# Patient Record
Sex: Female | Born: 1985 | Race: Black or African American | Hispanic: No | Marital: Single | Smoking: Former smoker
Health system: Southern US, Community
[De-identification: ages and names within clinical notes are randomized; demographics above are authoritative.]

## PROBLEM LIST (undated history)

## (undated) DIAGNOSIS — E0590 Thyrotoxicosis, unspecified without thyrotoxic crisis or storm: Secondary | ICD-10-CM

## (undated) DIAGNOSIS — D219 Benign neoplasm of connective and other soft tissue, unspecified: Secondary | ICD-10-CM

## (undated) DIAGNOSIS — D649 Anemia, unspecified: Secondary | ICD-10-CM

---

## 2012-10-24 DIAGNOSIS — E0590 Thyrotoxicosis, unspecified without thyrotoxic crisis or storm: Secondary | ICD-10-CM | POA: Insufficient documentation

## 2014-02-03 ENCOUNTER — Other Ambulatory Visit (HOSPITAL_COMMUNITY)
Admission: RE | Admit: 2014-02-03 | Discharge: 2014-02-03 | Disposition: A | Discharge status: Home / Self Care | Payer: 59 | Source: Ambulatory Visit | Attending: Physician Assistant | Admitting: Physician Assistant

## 2014-02-03 DIAGNOSIS — Z124 Encounter for screening for malignant neoplasm of cervix: Secondary | ICD-10-CM | POA: Insufficient documentation

## 2014-02-03 DIAGNOSIS — Z113 Encounter for screening for infections with a predominantly sexual mode of transmission: Secondary | ICD-10-CM | POA: Insufficient documentation

## 2014-02-03 DIAGNOSIS — Z1151 Encounter for screening for human papillomavirus (HPV): Secondary | ICD-10-CM | POA: Diagnosis present

## 2014-07-09 ENCOUNTER — Other Ambulatory Visit (HOSPITAL_COMMUNITY): Payer: Self-pay | Admitting: Internal Medicine

## 2014-07-09 DIAGNOSIS — E0590 Thyrotoxicosis, unspecified without thyrotoxic crisis or storm: Secondary | ICD-10-CM

## 2014-07-16 ENCOUNTER — Encounter (HOSPITAL_COMMUNITY): Payer: 59

## 2014-07-17 ENCOUNTER — Encounter (HOSPITAL_COMMUNITY): Payer: 59

## 2014-08-21 ENCOUNTER — Encounter (HOSPITAL_COMMUNITY): Payer: 59 | Attending: Internal Medicine

## 2014-08-22 ENCOUNTER — Encounter (HOSPITAL_COMMUNITY): Admission: RE | Admit: 2014-08-22 | Payer: 59 | Source: Ambulatory Visit

## 2015-04-23 ENCOUNTER — Encounter (HOSPITAL_BASED_OUTPATIENT_CLINIC_OR_DEPARTMENT_OTHER): Payer: Self-pay

## 2015-04-23 ENCOUNTER — Emergency Department (HOSPITAL_BASED_OUTPATIENT_CLINIC_OR_DEPARTMENT_OTHER)
Admission: EM | Admit: 2015-04-23 | Discharge: 2015-04-23 | Disposition: A | Discharge status: Home / Self Care | Payer: 59 | Attending: Emergency Medicine | Admitting: Emergency Medicine

## 2015-04-23 DIAGNOSIS — R000 Tachycardia, unspecified: Secondary | ICD-10-CM | POA: Diagnosis not present

## 2015-04-23 DIAGNOSIS — E0590 Thyrotoxicosis, unspecified without thyrotoxic crisis or storm: Secondary | ICD-10-CM | POA: Diagnosis not present

## 2015-04-23 DIAGNOSIS — Z720 Tobacco use: Secondary | ICD-10-CM | POA: Diagnosis not present

## 2015-04-23 DIAGNOSIS — E049 Nontoxic goiter, unspecified: Secondary | ICD-10-CM | POA: Insufficient documentation

## 2015-04-23 DIAGNOSIS — R634 Abnormal weight loss: Secondary | ICD-10-CM | POA: Insufficient documentation

## 2015-04-23 DIAGNOSIS — F1210 Cannabis abuse, uncomplicated: Secondary | ICD-10-CM | POA: Insufficient documentation

## 2015-04-23 DIAGNOSIS — R002 Palpitations: Secondary | ICD-10-CM | POA: Diagnosis present

## 2015-04-23 DIAGNOSIS — Z3202 Encounter for pregnancy test, result negative: Secondary | ICD-10-CM | POA: Diagnosis not present

## 2015-04-23 DIAGNOSIS — R61 Generalized hyperhidrosis: Secondary | ICD-10-CM | POA: Insufficient documentation

## 2015-04-23 HISTORY — DX: Thyrotoxicosis, unspecified without thyrotoxic crisis or storm: E05.90

## 2015-04-23 LAB — CBC WITH DIFFERENTIAL/PLATELET
BASOS ABS: 0 10*3/uL (ref 0.0–0.1)
Basophils Relative: 0 %
EOS ABS: 0.1 10*3/uL (ref 0.0–0.7)
Eosinophils Relative: 1 %
HCT: 32.9 % — ABNORMAL LOW (ref 36.0–46.0)
Hemoglobin: 10.6 g/dL — ABNORMAL LOW (ref 12.0–15.0)
LYMPHS ABS: 3.4 10*3/uL (ref 0.7–4.0)
Lymphocytes Relative: 58 %
MCH: 23.9 pg — ABNORMAL LOW (ref 26.0–34.0)
MCHC: 32.2 g/dL (ref 30.0–36.0)
MCV: 74.1 fL — ABNORMAL LOW (ref 78.0–100.0)
MONO ABS: 0.8 10*3/uL (ref 0.1–1.0)
Monocytes Relative: 13 %
Neutro Abs: 1.7 10*3/uL (ref 1.7–7.7)
Neutrophils Relative %: 28 %
PLATELETS: 323 10*3/uL (ref 150–400)
RBC: 4.44 MIL/uL (ref 3.87–5.11)
RDW: 15 % (ref 11.5–15.5)
WBC: 6 10*3/uL (ref 4.0–10.5)

## 2015-04-23 LAB — COMPREHENSIVE METABOLIC PANEL
ALK PHOS: 88 U/L (ref 38–126)
ALT: 20 U/L (ref 14–54)
AST: 21 U/L (ref 15–41)
Albumin: 3.3 g/dL — ABNORMAL LOW (ref 3.5–5.0)
Anion gap: 5 (ref 5–15)
BUN: 11 mg/dL (ref 6–20)
CO2: 25 mmol/L (ref 22–32)
CREATININE: 0.44 mg/dL (ref 0.44–1.00)
Calcium: 8.8 mg/dL — ABNORMAL LOW (ref 8.9–10.3)
Chloride: 105 mmol/L (ref 101–111)
GFR calc non Af Amer: >60 mL/min (ref >60)
Glucose, Bld: 98 mg/dL (ref 65–99)
Potassium: 3.9 mmol/L (ref 3.5–5.1)
SODIUM: 135 mmol/L (ref 135–145)
TOTAL PROTEIN: 6.9 g/dL (ref 6.5–8.1)
Total Bilirubin: 0.1 mg/dL — ABNORMAL LOW (ref 0.3–1.2)

## 2015-04-23 LAB — RAPID URINE DRUG SCREEN, HOSP PERFORMED
Amphetamines: NOT DETECTED
Barbiturates: NOT DETECTED
Benzodiazepines: NOT DETECTED
Cocaine: NOT DETECTED
OPIATES: NOT DETECTED
TETRAHYDROCANNABINOL: POSITIVE — AB

## 2015-04-23 LAB — T4, FREE: FREE T4: 4.58 ng/dL — AB (ref 0.61–1.12)

## 2015-04-23 LAB — PREGNANCY, URINE: PREG TEST UR: NEGATIVE

## 2015-04-23 LAB — TSH: TSH: <0.01 u[IU]/mL — ABNORMAL LOW (ref 0.350–4.500)

## 2015-04-23 MED ORDER — ATENOLOL 25 MG PO TABS: 25.0000 mg | ORAL_TABLET | Freq: Every day | ORAL | Status: DC

## 2015-04-23 MED ORDER — METHIMAZOLE 10 MG PO TABS: 10.0000 mg | ORAL_TABLET | Freq: Every day | ORAL | Status: DC

## 2015-04-23 NOTE — Discharge Instructions (Signed)
Hyperthyroidism Hyperthyroidism is when the thyroid is too active (overactive). Your thyroid is a large gland that is located in your neck. The thyroid helps to control how your body uses food (metabolism). When your thyroid is overactive, it produces too much of a hormone called thyroxine.  CAUSES Causes of hyperthyroidism may include:  Graves disease. This is when your immune system attacks the thyroid gland. This is the most common cause.  Inflammation of the thyroid gland.  Tumor in the thyroid gland or somewhere else.  Excessive use of thyroid medicines, including:  Prescription thyroid supplement.  Herbal supplements that mimic thyroid hormones.  Solid or fluid-filled lumps within your thyroid gland (thyroid nodules).  Excessive ingestion of iodine. RISK FACTORS  Being female.  Having a family history of thyroid conditions. SIGNS AND SYMPTOMS Signs and symptoms of hyperthyroidism may include:  Nervousness.  Inability to tolerate heat.  Unexplained weight loss.  Diarrhea.  Change in the texture of hair or skin.  Heart skipping beats or making extra beats.  Rapid heart rate.  Loss of menstruation.  Shaky hands.  Fatigue.  Restlessness.  Increased appetite.  Sleep problems.  Enlarged thyroid gland or nodules. DIAGNOSIS  Diagnosis of hyperthyroidism may include:  Medical history and physical exam.  Blood tests.  Ultrasound tests. TREATMENT Treatment may include:  Medicines to control your thyroid.  Surgery to remove your thyroid.  Radiation therapy. HOME CARE INSTRUCTIONS   Take medicines only as directed by your health care provider.  Do not use any tobacco products, including cigarettes, chewing tobacco, or electronic cigarettes. If you need help quitting, ask your health care provider.  Do not exercise or do physical activity until your health care provider approves.  Keep all follow-up appointments as directed by your health care  provider. This is important. SEEK MEDICAL CARE IF:  Your symptoms do not get better with treatment.  You have fever.  You are taking thyroid replacement medicine and you:  Have depression.  Feel mentally and physically slow.  Have weight gain. SEEK IMMEDIATE MEDICAL CARE IF:   You have decreased alertness or a change in your awareness.  You have abdominal pain.  You feel dizzy.  You have a rapid heartbeat.  You have an irregular heartbeat.   This information is not intended to replace advice given to you by your health care provider. Make sure you discuss any questions you have with your health care provider.   Document Released: 05/30/2005 Document Revised: 06/20/2014 Document Reviewed: 10/15/2013 Elsevier Interactive Patient Education 2016 [employer]vier Inc[employer]  

## 2015-04-23 NOTE — ED Notes (Signed)
Reports was driving down the highway and started having palpitations and became diaphoretic.  Pt reports has thyroid problems.

## 2015-04-23 NOTE — ED Provider Notes (Signed)
CSN: [id number]     Arrival date & time 04/23/15  1744 History  By signing my name below, I, Tula Nakayama, attest that this documentation has been prepared under the direction and in the presence of Davonna Belling, MD.  Electronically Signed: Tula Nakayama, ED Scribe. 04/23/2015. 6:16 PM.   Chief Complaint  Patient presents with  . Palpitations   The history is provided by the patient. No language interpreter was used.    HPI Comments: Dana Merritt is a 29 y.o. female with a history of hyperthyroidism who presents to the Emergency Department complaining of sudden onset palpitations and diaphoresis that started PTA while driving. Pt reports unexpected 60 lb weight loss since being diagnosed with hyperthyroidism 4 years ago. She also notes baseline difficulty breathing, which is unchanged today. Pt has a history of palpitations which last a few minutes before resolving on their own. She was being treated for hyperthyroidism with methimazole-10 mg daily, but stopped taking medication a few months ago due to financial difficulties. Pt smokes cigarettes. Her LMP was 1 month ago and normal. She denies recent long travel, weight loss pills, energy drinks, drug abuse and possibility of pregnancy. Pt also denies leg swelling as as an associated symptom.  PCP [facility] [address]   Past Medical History  Diagnosis Date  . Hyperthyroidism    History reviewed. No pertinent past surgical history. No family history on file. Social History  Substance Use Topics  . Smoking status: Current Some Day Smoker  . Smokeless tobacco: None  . Alcohol Use: No   OB History    No data available     Review of Systems  Constitutional: Positive for diaphoresis and unexpected weight change.  Cardiovascular: Positive for palpitations. Negative for leg swelling.  All other systems reviewed and are negative.  Allergies  Review of patient's allergies indicates no known allergies.  Home  Medications   Prior to Admission medications   Medication Sig Start Date End Date Taking? Authorizing Provider  atenolol (TENORMIN) 25 MG tablet Take 1 tablet (25 mg total) by mouth daily. 04/23/15   Davonna Belling, MD  methimazole (TAPAZOLE) 10 MG tablet Take 1 tablet (10 mg total) by mouth daily. 04/23/15   Davonna Belling, MD   BP 116/93 mmHg  Pulse 114  Temp(Src) 98.1 F (36.7 C) (Oral)  Resp 24  Ht 5\' 4"  (1.626 m)  Wt 200 lb (90.719 kg)  BMI 34.31 kg/m2  SpO2 100%  LMP 03/25/2015 Physical Exam  Constitutional: She appears well-developed and well-nourished. No distress.  HENT:  Head: Normocephalic and atraumatic.  Mouth/Throat: Uvula is midline.  Eyes: Conjunctivae and EOM are normal.  Neck: Neck supple. No tracheal deviation present.  Goiter  Cardiovascular: Regular rhythm and normal heart sounds.   Tachycardic  Pulmonary/Chest: Effort normal and breath sounds normal. No respiratory distress.  Abdominal: Soft. There is no tenderness.  Musculoskeletal: She exhibits no edema.  No peripheral edema  Skin: Skin is warm. She is diaphoretic.  Psychiatric: She has a normal mood and affect. Her behavior is normal.  Nursing note and vitals reviewed.   ED Course  Procedures  DIAGNOSTIC STUDIES: Oxygen Saturation is 100% on RA, normal by my interpretation.    COORDINATION OF CARE: 6:16 PM Discussed EKG and treatment plan with pt which includes lab work. Pt agreed to plan.  Labs Review Labs Reviewed  COMPREHENSIVE METABOLIC PANEL - Abnormal; Notable for the following:    Calcium 8.8 (*)    Albumin 3.3 (*)  Total Bilirubin 0.1 (*)    All other components within normal limits  CBC WITH DIFFERENTIAL/PLATELET - Abnormal; Notable for the following:    Hemoglobin 10.6 (*)    HCT 32.9 (*)    MCV 74.1 (*)    MCH 23.9 (*)    All other components within normal limits  URINE RAPID DRUG SCREEN, HOSP PERFORMED - Abnormal; Notable for the following:    Tetrahydrocannabinol  POSITIVE (*)    All other components within normal limits  PREGNANCY, URINE  TSH  T3, FREE  T4, FREE   Imaging Review No results found. I have personally reviewed and evaluated these lab results as part of my medical decision-making.   EKG Interpretation   Date/Time:  Thursday April 23 2015 19:04:10 EST Ventricular Rate:  113 PR Interval:  124 QRS Duration: 82 QT Interval:  344 QTC Calculation: 471 R Axis:   75 Text Interpretation:  Sinus tachycardia Otherwise normal ECG Confirmed by  Alvino Chapel  MD, Ovid Curd [phone]) on 04/23/2015 6:05:24 PM      MDM   Final diagnoses:  Hyperthyroidism    Patient with goiter in signs and symptoms of hyperthyroidism. He has had the same and has been off her medication. Will start atenolol for symptom management and add Tapazole for thyroid treatment. Will have follow-up with a new PCP.  I personally performed the services described in this documentation, which was scribed in my presence. The recorded information has been reviewed and is accurate.      Davonna Belling, MD 04/23/15 2100

## 2015-04-24 LAB — T3, FREE: T3, Free: 22.2 pg/mL — ABNORMAL HIGH (ref 2.0–4.4)

## 2015-07-09 ENCOUNTER — Ambulatory Visit (INDEPENDENT_AMBULATORY_CARE_PROVIDER_SITE_OTHER): Payer: 59 | Admitting: Endocrinology

## 2015-07-09 ENCOUNTER — Encounter: Payer: Self-pay | Admitting: Endocrinology

## 2015-07-09 ENCOUNTER — Encounter: Payer: Self-pay | Admitting: Gastroenterology

## 2015-07-09 VITALS — BP 126/88 | HR 91 | Temp 98.4°F | Resp 14 | Ht 64.0 in | Wt 207.8 lb

## 2015-07-09 DIAGNOSIS — R1901 Right upper quadrant abdominal swelling, mass and lump: Secondary | ICD-10-CM | POA: Diagnosis not present

## 2015-07-09 DIAGNOSIS — D509 Iron deficiency anemia, unspecified: Secondary | ICD-10-CM | POA: Diagnosis not present

## 2015-07-09 DIAGNOSIS — E0590 Thyrotoxicosis, unspecified without thyrotoxic crisis or storm: Secondary | ICD-10-CM

## 2015-07-09 MED ORDER — METHIMAZOLE 10 MG PO TABS: 20.0000 mg | ORAL_TABLET | Freq: Every day | ORAL | Status: DC

## 2015-07-09 NOTE — Patient Instructions (Signed)
Take Rx 2x daily

## 2015-07-09 NOTE — Progress Notes (Signed)
Patient ID: Dana Merritt, female   DOB: [birth year], 30 y.o.   MRN: [id number]                                                                                                Reason for Appointment:  Hyperthyroidism, new consultation   History of Present Illness:   Previous records of the patient are not available today  Patient states that about 4 years ago she started feeling dizzy and also had apparently lost about 60 pounds.  She was found to be hyperthyroid at bedtime but does not remember other symptoms that she has had before. She does not report palpitations except an occasional episode and no heat intolerance or sweating She does not feel excessively nervous or shaky although other people have noticed her hands to be shaky at times She does feel tired but not week.  She thinks her fatigue may be related to her busy schedule  She has been treated with methimazole, mostly 10 mg over the last 4 years. She has not been consistent with this and has been seeing various doctors at different times, has seen an endocrinologist also She thinks she has been on the medication about 70% of the time She believes that her thyroid levels have never been told to be normal, initially on starting treatment she was given higher dose of methimazole but she did not tolerate it for some reason  In 2016 she ran out of the medication for a few months and was seen in the ER because of chest pain and palpitations She was found to be frankly hyperthyroid and started back on methimazole 10 mg daily  More recently the patient has not felt any unusual fatigue, palpitations, heat intolerance or shakiness. She tends to have frequent bowel movements She has regained about 7 pounds that she had lost last year  Recent labs done on 06/19/15 show free T4 level of 2.9  Lab Results  Component Value Date   FREET4 4.58* 04/23/2015   TSH <0.010* 04/23/2015        Medication List       This list is  accurate as of: 07/09/15  1:00 PM.  Always use your most recent med list.               LUTERA 0.1-20 MG-MCG tablet  Generic drug:  levonorgestrel-ethinyl estradiol     methimazole 10 MG tablet  Commonly known as:  TAPAZOLE  Take 2 tablets (20 mg total) by mouth daily.            Past Medical History  Diagnosis Date  . Hyperthyroidism     No past surgical history on file.  No family history on file.  Social History:  reports that she has been smoking.  She does not have any smokeless tobacco history on file. She reports that she does not drink alcohol or use illicit drugs.  Allergies: No Known Allergies  Review of Systems:  Review of Systems  Constitutional: Positive for weight gain and malaise. Negative for reduced appetite.  HENT: Negative for headaches.   Eyes: Negative  for blurred vision.  Respiratory: Negative for shortness of breath.   Cardiovascular: Negative for palpitations.  Gastrointestinal:       Increase frequency of bowel movements, not loose, 3-4x per day  Endocrine: Negative for menstrual changes and polydipsia.  Genitourinary: Negative for frequency.  Skin: Negative for rash.  Neurological: Negative for weakness and tremors.  Psychiatric/Behavioral: Negative for nervousness.      Examination:   BP 126/88 mmHg  Pulse 91  Temp(Src) 98.4 F (36.9 C)  Resp 14  Ht 5\' 4"  (1.626 m)  Wt 207 lb 12.8 oz (94.257 kg)  BMI 35.65 kg/m2  SpO2 97%   General Appearance:  well-built and nourished, pleasant, not anxious or hyperkinetic.        Eyes: No unusual prominence, lid lag or stare. No swelling of the eyelids   Neck: The thyroid is enlarged about 3 times normal on the right and 2 times on the left, smooth, non-tender.  No bruit heard. There is no lymphadenopathy .           Heart: normal S1 and S2, no murmurs .          Lungs: breath sounds are clear bilaterally Abdomen: no hepatosplenomegaly Firm nontender mass in the right midabdomen present,  relatively dull on percussion.  No other palpable abnormality  Extremities: hands are warm but not diaphoretic. No ankle edema. Neurological: Deep tendon reflexes at biceps are hyperactive. No significant fine tremors are present. Skin: No rash, abnormal thickening of the skin on legs or pigmentation seen     Assessment/Plan:   Hyperthyroidism, Likely to be from Graves' disease    Discussed with the patient the hyperthyroidism as being an autoimmune thyroid disease.  Explained the options for treatment including antithyroid drugs and radioactive iodine.  Discussed the pros and cons for each treatment: Since she has had hyperthyroidism for about 4 years without remission or adequate control she may well be better off doing the radioactive iodine treatment Also has a significant goiter on exam Discussed how this would work and need for thyroid supplementation lifelong subsequently  She is somewhat reluctant to do that radioactive iodine treatment at this time She agrees to increase her methimazole to 10 mg twice a day However explained to her that if she is not in remission and off the Tapazole by the end of the year she should consider radioactive iodine treatment  Patient handout on hyperthyroidism and radioactive iodine treatment given Discussed possible side effects of methimazole Patient understands the above discussion and treatment options. All questions were answered satisfactorily  ABDOMINAL mass: Has an ill-defined right-sided abdominal mass without tenderness  Will have her be evaluated for GI conditions especially with her iron deficiency anemia noted by her PCP   [facility] 07/09/2015, 1:00 PM

## 2015-07-16 ENCOUNTER — Emergency Department (HOSPITAL_BASED_OUTPATIENT_CLINIC_OR_DEPARTMENT_OTHER): Payer: 59

## 2015-07-16 ENCOUNTER — Emergency Department (HOSPITAL_BASED_OUTPATIENT_CLINIC_OR_DEPARTMENT_OTHER)
Admission: EM | Admit: 2015-07-16 | Discharge: 2015-07-16 | Disposition: A | Discharge status: Home / Self Care | Payer: 59 | Attending: Emergency Medicine | Admitting: Emergency Medicine

## 2015-07-16 ENCOUNTER — Encounter: Payer: Self-pay | Admitting: Gastroenterology

## 2015-07-16 ENCOUNTER — Encounter (HOSPITAL_BASED_OUTPATIENT_CLINIC_OR_DEPARTMENT_OTHER): Payer: Self-pay

## 2015-07-16 DIAGNOSIS — F17200 Nicotine dependence, unspecified, uncomplicated: Secondary | ICD-10-CM | POA: Diagnosis not present

## 2015-07-16 DIAGNOSIS — Z3202 Encounter for pregnancy test, result negative: Secondary | ICD-10-CM | POA: Insufficient documentation

## 2015-07-16 DIAGNOSIS — H0520 Unspecified exophthalmos: Secondary | ICD-10-CM | POA: Diagnosis not present

## 2015-07-16 DIAGNOSIS — F419 Anxiety disorder, unspecified: Secondary | ICD-10-CM | POA: Diagnosis not present

## 2015-07-16 DIAGNOSIS — R079 Chest pain, unspecified: Secondary | ICD-10-CM

## 2015-07-16 DIAGNOSIS — E0590 Thyrotoxicosis, unspecified without thyrotoxic crisis or storm: Secondary | ICD-10-CM | POA: Insufficient documentation

## 2015-07-16 DIAGNOSIS — Z79899 Other long term (current) drug therapy: Secondary | ICD-10-CM | POA: Diagnosis not present

## 2015-07-16 LAB — TSH: TSH: 0.022 u[IU]/mL — ABNORMAL LOW (ref 0.350–4.500)

## 2015-07-16 LAB — PREGNANCY, URINE: PREG TEST UR: NEGATIVE

## 2015-07-16 MED ORDER — ATENOLOL 25 MG PO TABS: 25.0000 mg | ORAL_TABLET | Freq: Every day | ORAL | Status: DC

## 2015-07-16 MED ORDER — PROPRANOLOL HCL 10 MG PO TABS
10.0000 mg | ORAL_TABLET | Freq: Once | ORAL | Status: DC
  Filled 2015-07-16: qty 1

## 2015-07-16 MED ORDER — ATENOLOL 25 MG PO TABS
25.0000 mg | ORAL_TABLET | Freq: Once | ORAL | Status: AC
  Administered 2015-07-16: 25 mg via ORAL
  Filled 2015-07-16: qty 1

## 2015-07-16 NOTE — ED Notes (Signed)
MD at bedside discussing test results and dispo plan of care. 

## 2015-07-16 NOTE — ED Provider Notes (Signed)
CSN: [id number]     Arrival date & time 07/16/15  [id number] History  By signing my name below, I, Dana Merritt, attest that this documentation has been prepared under the direction and in the presence of Dana Etienne, DO. Electronically Signed: [name], ED Scribe. 07/16/2015. 6:54 PM.   Chief Complaint  Patient presents with  . Chest Pain      Patient is a 30 y.o. female presenting with chest pain. The history is provided by the patient. No language interpreter was used.  Chest Pain Pain location:  L chest (left upper) Pain quality: pressure and sharp   Pain quality comment:  Pinching Pain radiates to:  Does not radiate Pain radiates to the back: no   Pain severity:  Mild Onset quality:  Sudden Duration:  3 hours Timing:  Constant Progression:  Unchanged Chronicity:  New Context: stress   Relieved by:  None tried Worsened by:  Nothing tried Ineffective treatments:  None tried Associated symptoms: no cough, no dizziness, no fever, no headache, no nausea, no palpitations, no shortness of breath and not vomiting   Risk factors comment:  Hyperthyroidism   HPI Comments: Dana Merritt is a 30 y.o. female with a medical hx of hyperthyroidism who presents to the Emergency Department complaining of constant, sharp/pressure/pinching, left upper CP onset 5 PM. She notes that she was going to school when the CP began. Pt was seen [facility] and had elevated blood pressure and was informed to follow up in the ED for further evaluation of her symptoms. She denies missing any dosages of her tapazole at this time. She states that her CP is worsened with stress and there are no alleviating factors. She notes that she was seen by her PCP 8 days ago and the provider double the dosage of her tapazole. She reports that she was seen for similar symptoms in 04/2016 and her symptoms were blood pressure, CP, and non-compliance with her thyroid medications. Pt states that since her visit  with Dr. [name] on last week, she has been compliant with her thyroid medications. She reports that her next appointment with Dr. [name] is on 07/30/2015.  She states that she has not tried any medications for the relief for her symptoms. She denies cough, congestion, fever, leg swelling, and any other symptoms. Pt denies recent travel, immobilization, or PMHx of blood clots. Denies heavy caffeine use or cocaine use at this time.    Per pt chart review: Pt was seen by Dr. [name] at [facility] on 07/09/2015 for her hyperthyroidism. Per Dr. [name] note, pt had not been fully compliant with her thyroid medications, nor has see been seen consistently by one provider for her hyperthyroidism. Dr. [name] noted that the pt ran out of her medications in 2016 and was seen in the ED for CP and palpitations. Dr. [name] informed the pt that since she has not been in remission since the onset of her hyperthyroidism 4 years ago and has not had consistent visits with a provider, that radioactive iodine treatment should be considered. Dr. [name] informed the pt to continue use of the lutera and methiamazole Rx that were started in late 2016. Dr. [name] adjusted the methimazole to 10 mg twice daily.   Past Medical History  Diagnosis Date  . Hyperthyroidism    History reviewed. No pertinent past surgical history. No family history on file. Social History  Substance Use Topics  . Smoking status: Current Some Day Smoker  .  Smokeless tobacco: None  . Alcohol Use: Yes     Comment: occ   OB History    No data available     Review of Systems  Constitutional: Negative for fever and chills.  HENT: Negative for congestion and rhinorrhea.   Eyes: Negative for redness and visual disturbance.  Respiratory: Negative for cough, shortness of breath and wheezing.   Cardiovascular: Positive for chest pain. Negative for palpitations.  Gastrointestinal: Negative for nausea and vomiting.  Genitourinary: Negative for  dysuria and urgency.  Musculoskeletal: Negative for myalgias and arthralgias.  Skin: Negative for pallor and wound.  Neurological: Negative for dizziness and headaches.  All other systems reviewed and are negative.    Allergies  Review of patient's allergies indicates no known allergies.  Home Medications   Prior to Admission medications   Medication Sig Start Date End Date Taking? Authorizing Provider  atenolol (TENORMIN) 25 MG tablet Take 1 tablet (25 mg total) by mouth daily. 07/16/15   Dana Etienne, DO  LUTERA 0.1-20 MG-MCG tablet  06/25/15   Historical Provider, MD  methimazole (TAPAZOLE) 10 MG tablet Take 2 tablets (20 mg total) by mouth daily. 07/09/15   [name], MD   BP 158/92 mmHg  Pulse 98  Temp(Src) 98.3 F (36.8 C) (Oral)  Resp 18  Ht 5\' 4"  (1.626 m)  Wt 208 lb (94.348 kg)  BMI 35.69 kg/m2  SpO2 100%  LMP 06/25/2015 Physical Exam  Constitutional: She is oriented to person, place, and time. She appears well-developed and well-nourished. No distress.  HENT:  Head: Normocephalic and atraumatic.  Eyes: EOM are normal. Pupils are equal, round, and reactive to light.  Mild proptosis   Neck: Normal range of motion. Neck supple.  Cardiovascular: Normal rate, regular rhythm and normal heart sounds.  Exam reveals no gallop and no friction rub.   No murmur heard. Pulmonary/Chest: Effort normal and breath sounds normal. No respiratory distress. She has no wheezes. She has no rales.  Abdominal: Soft. She exhibits no distension. There is no tenderness.  Musculoskeletal: She exhibits no edema or tenderness.  Neurological: She is alert and oriented to person, place, and time.  Skin: Skin is warm and dry. She is not diaphoretic.  Psychiatric: Her behavior is normal. Her mood appears anxious.  Nursing note and vitals reviewed.   ED Course  Procedures (including critical care time) DIAGNOSTIC STUDIES: Oxygen Saturation is 100% on RA, nl by my interpretation.    COORDINATION  OF CARE: 6:54 PM Discussed treatment plan with pt at bedside which includes labs, UA, CXR, EKG and pt agreed to plan.   Labs Review Labs Reviewed  PREGNANCY, URINE  T4  TSH    Imaging Review Dg Chest 2 View  07/16/2015  CLINICAL DATA:  Chest tightness and elevated blood pressure. EXAM: CHEST  2 VIEW COMPARISON:  None. FINDINGS: The heart size and mediastinal contours are within normal limits. Both lungs are clear. No pleural effusion or pneumothorax. The visualized skeletal structures are unremarkable. IMPRESSION: No active cardiopulmonary disease. Electronically Signed   By: Lajean Manes M.D.   On: 07/16/2015 19:24   I have personally reviewed and evaluated these images and lab results as part of my medical decision-making.   EKG Interpretation   Date/Time:  Thursday July 16 2015 18:37:34 EST Ventricular Rate:  93 PR Interval:  128 QRS Duration: 84 QT Interval:  363 QTC Calculation: 451 R Axis:   51 Text Interpretation:  Sinus rhythm Probable left atrial enlargement ST  elev,  probable normal early repol pattern No significant change since last  tracing Confirmed by Bianka Liberati MD, DANIEL [phone]) on 07/16/2015 6:45:20 PM      MDM   Final diagnoses:  Hyperthyroidism  Chest pain, unspecified chest pain type    30 yo F with a chief complaint of palpitations and a sharp left lateral chest pain. Patient states the symptoms or recurrence of when she had significant hyperthyroidism. Patient recently had an increase of her methimazole which she states that she is taking normally. Patient was recently seen by her endocrinologist and his note felt that she only took it about 7 out of 10 times. Suspect that this is the likely cause of her symptoms. Unable to perk because patient is on estrogen birth control. However feel like this is unlikely with the current symptomatology feel this is more likely caused by the thyroid disease. We are unable to obtain the TSH and T4 results as they're send  outs from [facility]. Will have her call her endocrinologist in the morning for the results. Given 3 pills of atenolol.  7:45 PM:  I have discussed the diagnosis/risks/treatment options with the patient and family and believe the pt to be eligible for discharge home to follow-up with PCP. We also discussed returning to the ED immediately if new or worsening sx occur. We discussed the sx which are most concerning (e.g., sudden worsening pain, fever, inability to tolerate by mouth) that necessitate immediate return. Medications administered to the patient during their visit and any new prescriptions provided to the patient are listed below.  Medications given during this visit Medications  propranolol (INDERAL) tablet 10 mg (not administered)  atenolol (TENORMIN) tablet 25 mg (not administered)    New Prescriptions   ATENOLOL (TENORMIN) 25 MG TABLET    Take 1 tablet (25 mg total) by mouth daily.    The patient appears reasonably screen and/or stabilized for discharge and I doubt any other medical condition or other [facility] requiring further screening, evaluation, or treatment in the ED at this time prior to discharge.     I personally performed the services described in this documentation, which was scribed in my presence. The recorded information has been reviewed and is accurate.    Dana Etienne, DO 07/16/15 1946

## 2015-07-16 NOTE — Discharge Instructions (Signed)
Hyperthyroidism Hyperthyroidism is when the thyroid is too active (overactive). Your thyroid is a large gland that is located in your neck. The thyroid helps to control how your body uses food (metabolism). When your thyroid is overactive, it produces too much of a hormone called thyroxine.  CAUSES Causes of hyperthyroidism may include:  Graves disease. This is when your immune system attacks the thyroid gland. This is the most common cause.  Inflammation of the thyroid gland.  Tumor in the thyroid gland or somewhere else.  Excessive use of thyroid medicines, including:  Prescription thyroid supplement.  Herbal supplements that mimic thyroid hormones.  Solid or fluid-filled lumps within your thyroid gland (thyroid nodules).  Excessive ingestion of iodine. RISK FACTORS  Being female.  Having a family history of thyroid conditions. SIGNS AND SYMPTOMS Signs and symptoms of hyperthyroidism may include:  Nervousness.  Inability to tolerate heat.  Unexplained weight loss.  Diarrhea.  Change in the texture of hair or skin.  Heart skipping beats or making extra beats.  Rapid heart rate.  Loss of menstruation.  Shaky hands.  Fatigue.  Restlessness.  Increased appetite.  Sleep problems.  Enlarged thyroid gland or nodules. DIAGNOSIS  Diagnosis of hyperthyroidism may include:  Medical history and physical exam.  Blood tests.  Ultrasound tests. TREATMENT Treatment may include:  Medicines to control your thyroid.  Surgery to remove your thyroid.  Radiation therapy. HOME CARE INSTRUCTIONS   Take medicines only as directed by your health care provider.  Do not use any tobacco products, including cigarettes, chewing tobacco, or electronic cigarettes. If you need help quitting, ask your health care provider.  Do not exercise or do physical activity until your health care provider approves.  Keep all follow-up appointments as directed by your health care  provider. This is important. SEEK MEDICAL CARE IF:  Your symptoms do not get better with treatment.  You have fever.  You are taking thyroid replacement medicine and you:  Have depression.  Feel mentally and physically slow.  Have weight gain. SEEK IMMEDIATE MEDICAL CARE IF:   You have decreased alertness or a change in your awareness.  You have abdominal pain.  You feel dizzy.  You have a rapid heartbeat.  You have an irregular heartbeat.   This information is not intended to replace advice given to you by your health care provider. Make sure you discuss any questions you have with your health care provider.   Document Released: 05/30/2005 Document Revised: 06/20/2014 Document Reviewed: 10/15/2013 Elsevier Interactive Patient Education 2016 [employer]vier Inc[employer]  

## 2015-07-16 NOTE — ED Notes (Signed)
Pt states her doctor changed her dosage on her thyroid medication last Wednesday, doubling the dosage.

## 2015-07-16 NOTE — ED Notes (Signed)
C/o CP since 5pm

## 2015-07-16 NOTE — ED Notes (Signed)
Patient transported to X-ray 

## 2015-07-17 LAB — T4: T4, Total: 17.9 ug/dL — ABNORMAL HIGH (ref 4.5–12.0)

## 2015-07-22 ENCOUNTER — Ambulatory Visit: Payer: 59 | Admitting: Gastroenterology

## 2015-07-28 ENCOUNTER — Other Ambulatory Visit (INDEPENDENT_AMBULATORY_CARE_PROVIDER_SITE_OTHER): Payer: 59

## 2015-07-28 DIAGNOSIS — E0590 Thyrotoxicosis, unspecified without thyrotoxic crisis or storm: Secondary | ICD-10-CM | POA: Diagnosis not present

## 2015-07-28 LAB — T4, FREE: FREE T4: 2.7 ng/dL — AB (ref 0.60–1.60)

## 2015-07-30 ENCOUNTER — Encounter: Payer: Self-pay | Admitting: Endocrinology

## 2015-07-30 ENCOUNTER — Ambulatory Visit (INDEPENDENT_AMBULATORY_CARE_PROVIDER_SITE_OTHER): Payer: 59 | Admitting: Endocrinology

## 2015-07-30 VITALS — BP 142/87 | HR 94 | Temp 97.9°F | Ht 64.0 in | Wt 207.0 lb

## 2015-07-30 DIAGNOSIS — E0590 Thyrotoxicosis, unspecified without thyrotoxic crisis or storm: Secondary | ICD-10-CM | POA: Diagnosis not present

## 2015-07-30 NOTE — Patient Instructions (Signed)
Take 1 1/2 pills twice daily  See your PCP

## 2015-07-30 NOTE — Progress Notes (Signed)
Patient ID: Dana Merritt, female   DOB: May 03, 1986, 30 y.o.   MRN: [id number]                                                                                                Reason for Appointment:  Hyperthyroidism, follow-up   History of Present Illness:   Prior history: Patient states that about 4 years ago she started feeling dizzy and also had apparently lost about 60 pounds.  She was found to be hyperthyroid at bedtime but does not remember other symptoms that she has had before. She has been treated with methimazole, mostly 10 mg over the last 4 years. She has not been consistent with this and has been seeing various doctors at different times, has seen an endocrinologist also She thinks she has been on the medication about 70% of the time She believes that her thyroid levels have never been told to be normal, initially on starting treatment she was given higher dose of methimazole but she did not tolerate it for some reason  In 2016 she ran out of the medication for a few months and was seen in the ER because of chest pain and palpitations  More recently the patient has not felt any unusual fatigue, palpitations, heat intolerance or shakiness. She tends to have frequent bowel movements but this is better with increasing her methimazole Her weight is stable She had an episode of chest discomfort and was seen in the ER   Wt Readings from Last 3 Encounters:  07/30/15 207 lb (93.895 kg)  07/16/15 208 lb (94.348 kg)  07/09/15 207 lb 12.8 oz (94.257 kg)    Previous labs done on 06/19/15 show free T4 level of 2.9  Lab Results  Component Value Date   FREET4 2.70* 07/28/2015   FREET4 4.58* 04/23/2015   TSH 0.022* 07/16/2015   TSH <0.010* 04/23/2015        Medication List       This list is accurate as of: 07/30/15  1:29 PM.  Always use your most recent med list.               atenolol 25 MG tablet  Commonly known as:  TENORMIN  Take 1 tablet (25 mg total) by  mouth daily.     LUTERA 0.1-20 MG-MCG tablet  Generic drug:  levonorgestrel-ethinyl estradiol     methimazole 10 MG tablet  Commonly known as:  TAPAZOLE  Take 2 tablets (20 mg total) by mouth daily.            Past Medical History  Diagnosis Date  . Hyperthyroidism     No past surgical history on file.  No family history on file.  Social History:  reports that she has been smoking.  She does not have any smokeless tobacco history on file. She reports that she drinks alcohol. She reports that she does not use illicit drugs.  Allergies: No Known Allergies  Review of Systems:  Review of Systems    Examination:   BP 142/87 mmHg  Pulse 94  Temp(Src) 97.9 F (36.6 C) (Oral)  Ht 5\' 4"  (1.626 m)  Wt 207 lb (93.895 kg)  BMI 35.51 kg/m2  SpO2   LMP 06/25/2015   General Appearance:   not anxious or hyperkinetic.        Eyes: No unusual prominence, lid lag or stare. No swelling of the eyelids   Neck: The thyroid is enlarged about 3 times normal on the right and 2 times on the left, smooth, slightly firm  Firm nontender mass in the right midabdomen present, relatively dull on percussion measuring about 4-6 cm.  No other palpable abnormality  Extremities: hands are warm but not diaphoretic. Neurological: Deep tendon reflexes at biceps are brisk.   Assessment/Plan:   Hyperthyroidism,  from Graves' disease   She still is continuing to be hyperthyroid with taking 20 mg of methimazole Free T4 is still significantly high and not much improved compared to 06/2015  Discussed again the need to consider radioactive iodine treatment as a definitive treatment because of her long history of the disease as well as difficulty getting her levels under control as well as a large goiter She tends to get nauseated with taking 20 mg of methimazole at the same time   Again given her a handout on projective iodine treatment Meanwhile she will increase her methimazole by 1 tablet today and  divided doses  ABDOMINAL mass: Has an firm right-sided abdominal mass without tenderness, not clear if this is in the abdominal wall  Will have her be evaluated by her PCP   [facility] 07/30/2015, 1:29 PM

## 2015-08-25 ENCOUNTER — Other Ambulatory Visit: Payer: 59

## 2015-08-31 ENCOUNTER — Encounter: Payer: Self-pay | Admitting: Endocrinology

## 2015-09-01 ENCOUNTER — Ambulatory Visit: Payer: 59 | Admitting: Endocrinology

## 2015-09-14 ENCOUNTER — Ambulatory Visit: Payer: 59 | Admitting: Gastroenterology

## 2016-03-10 ENCOUNTER — Encounter (HOSPITAL_BASED_OUTPATIENT_CLINIC_OR_DEPARTMENT_OTHER): Payer: Self-pay | Admitting: Emergency Medicine

## 2016-03-10 ENCOUNTER — Emergency Department (HOSPITAL_BASED_OUTPATIENT_CLINIC_OR_DEPARTMENT_OTHER)
Admission: EM | Admit: 2016-03-10 | Discharge: 2016-03-10 | Disposition: A | Discharge status: Home / Self Care | Payer: 59 | Attending: Emergency Medicine | Admitting: Emergency Medicine

## 2016-03-10 ENCOUNTER — Emergency Department (HOSPITAL_BASED_OUTPATIENT_CLINIC_OR_DEPARTMENT_OTHER): Payer: 59

## 2016-03-10 DIAGNOSIS — Z87891 Personal history of nicotine dependence: Secondary | ICD-10-CM | POA: Insufficient documentation

## 2016-03-10 DIAGNOSIS — D649 Anemia, unspecified: Secondary | ICD-10-CM

## 2016-03-10 DIAGNOSIS — R1013 Epigastric pain: Secondary | ICD-10-CM | POA: Diagnosis present

## 2016-03-10 DIAGNOSIS — K529 Noninfective gastroenteritis and colitis, unspecified: Secondary | ICD-10-CM | POA: Diagnosis not present

## 2016-03-10 LAB — URINALYSIS, ROUTINE W REFLEX MICROSCOPIC
Bilirubin Urine: NEGATIVE
GLUCOSE, UA: NEGATIVE mg/dL
HGB URINE DIPSTICK: NEGATIVE
Ketones, ur: NEGATIVE mg/dL
Leukocytes, UA: NEGATIVE
Nitrite: NEGATIVE
PH: 7 (ref 5.0–8.0)
Protein, ur: NEGATIVE mg/dL
SPECIFIC GRAVITY, URINE: 1.016 (ref 1.005–1.030)

## 2016-03-10 LAB — CBC
HCT: 27.9 % — ABNORMAL LOW (ref 36.0–46.0)
HEMOGLOBIN: 8.2 g/dL — AB (ref 12.0–15.0)
MCH: 16.5 pg — AB (ref 26.0–34.0)
MCHC: 29.4 g/dL — AB (ref 30.0–36.0)
MCV: 56.3 fL — AB (ref 78.0–100.0)
PLATELETS: 428 10*3/uL — AB (ref 150–400)
RBC: 4.96 MIL/uL (ref 3.87–5.11)
RDW: 21.6 % — ABNORMAL HIGH (ref 11.5–15.5)
WBC: 3.9 10*3/uL — ABNORMAL LOW (ref 4.0–10.5)

## 2016-03-10 LAB — PREGNANCY, URINE: PREG TEST UR: NEGATIVE

## 2016-03-10 MED ORDER — HYDROCODONE-ACETAMINOPHEN 5-325 MG PO TABS: 1.0000 | ORAL_TABLET | Freq: Four times a day (QID) | ORAL | 0 refills | Status: AC | PRN

## 2016-03-10 MED ORDER — HYDROMORPHONE HCL 1 MG/ML IJ SOLN
1.0000 mg | Freq: Once | INTRAMUSCULAR | Status: AC
  Administered 2016-03-10: 1 mg via INTRAVENOUS
  Filled 2016-03-10: qty 1

## 2016-03-10 MED ORDER — SODIUM CHLORIDE 0.9 % IV BOLUS (SEPSIS)
1000.0000 mL | Freq: Once | INTRAVENOUS | Status: AC
  Administered 2016-03-10: 1000 mL via INTRAVENOUS

## 2016-03-10 MED ORDER — PROMETHAZINE HCL 25 MG PO TABS: 25.0000 mg | ORAL_TABLET | Freq: Four times a day (QID) | ORAL | 1 refills | Status: AC | PRN

## 2016-03-10 MED ORDER — ONDANSETRON HCL 4 MG/2ML IJ SOLN
4.0000 mg | Freq: Once | INTRAMUSCULAR | Status: AC
  Administered 2016-03-10: 4 mg via INTRAVENOUS
  Filled 2016-03-10: qty 2

## 2016-03-10 MED ORDER — IOPAMIDOL (ISOVUE-300) INJECTION 61%
100.0000 mL | Freq: Once | INTRAVENOUS | Status: AC | PRN
  Administered 2016-03-10: 100 mL via INTRAVENOUS

## 2016-03-10 MED ORDER — SODIUM CHLORIDE 0.9 % IV SOLN: INTRAVENOUS | Status: DC

## 2016-03-10 MED FILL — HYDROCODON-APAP 5-325: 5-325 | 2 days supply | Qty: 10 | Fill #0

## 2016-03-10 MED FILL — PROMETHAZINE 25 MG TABLET: 25 | 3 days supply | Qty: 12 | Fill #0

## 2016-03-10 NOTE — ED Notes (Signed)
States pain is upper mid abd, also tenderness noted to LLQ.

## 2016-03-10 NOTE — Discharge Instructions (Signed)
Follow-up with your GYN Dr. for the anemia and the uterine fibroids. Not take the Phenergan as needed for nausea and vomiting. Take hydrocodone as needed for the epigastric abdominal pain. Return for any new or worse symptoms. Work note provided.

## 2016-03-10 NOTE — ED Provider Notes (Signed)
[place] [facility] Provider Note   CSN: [id number] Arrival date & time: 03/10/16  1017     History   Chief Complaint Chief Complaint  Patient presents with  . Abdominal Pain    HPI Dana Merritt is a 30 y.o. female.  The patient was symptoms since Monday of intermittent abdominal pain in the epigastric area nausea vomiting and diarrhea. No blood in her bowel movements. Abdominal pain is rated at 6 out of 10. Patient states that symptoms started after eating ice cream on Monday.      Past Medical History:  Diagnosis Date  . Hyperthyroidism     Patient Active Problem List   Diagnosis Date Noted  . Hyperthyroidism 10/24/2012    History reviewed. No pertinent surgical history.  OB History    No data available       Home Medications    Prior to Admission medications   Medication Sig Start Date End Date Taking? Authorizing Provider  HYDROcodone-acetaminophen (NORCO/VICODIN) 5-325 MG tablet Take 1-2 tablets by mouth every 6 (six) hours as needed for moderate pain. 03/10/16   Fredia Sorrow, MD  promethazine (PHENERGAN) 25 MG tablet Take 1 tablet (25 mg total) by mouth every 6 (six) hours as needed for nausea or vomiting. 03/10/16   Fredia Sorrow, MD    Family History No family history on file.  Social History Social History  Substance Use Topics  . Smoking status: Former Research scientist (life sciences)  . Smokeless tobacco: Never Used  . Alcohol use Yes     Comment: occ     Allergies   Review of patient's allergies indicates no known allergies.   Review of Systems Review of Systems  Constitutional: Negative for fever.  HENT: Negative for congestion.   Eyes: Negative for visual disturbance.  Respiratory: Negative for shortness of breath.   Cardiovascular: Negative for chest pain.  Gastrointestinal: Positive for abdominal pain, diarrhea, nausea and vomiting. Negative for blood in stool.  Genitourinary: Negative for dysuria and vaginal bleeding.    Musculoskeletal: Negative for back pain.  Skin: Negative for rash.  Neurological: Negative for headaches.  Hematological: Does not bruise/bleed easily.  Psychiatric/Behavioral: Negative for confusion.     Physical Exam Updated Vital Signs BP 139/59 (BP Location: Right Arm)   Pulse 101   Temp 98.5 F (36.9 C)   Resp 18   Ht 5\' 4"  (1.626 m)   Wt 85.7 kg   LMP 02/28/2016 Comment: neg u preg in ed  SpO2 100%   BMI 32.44 kg/m   Physical Exam  Constitutional: She is oriented to person, place, and time. She appears well-developed and well-nourished. No distress.  HENT:  Head: Normocephalic and atraumatic.  Mouth/Throat: Oropharynx is clear and moist.  Eyes: EOM are normal. Pupils are equal, round, and reactive to light.  Neck: Normal range of motion. Neck supple.  Cardiovascular: Normal rate, regular rhythm and normal heart sounds.   Pulmonary/Chest: Effort normal and breath sounds normal. No respiratory distress.  Abdominal: Soft. Bowel sounds are normal. There is no tenderness.  Neurological: She is alert and oriented to person, place, and time. No cranial nerve deficit. She exhibits normal muscle tone. Coordination normal.  Skin: Skin is warm.  Nursing note and vitals reviewed.    ED Treatments / Results  Labs (all labs ordered are listed, but only abnormal results are displayed) Labs Reviewed  URINALYSIS, ROUTINE W REFLEX MICROSCOPIC (NOT AT [facility]) - Abnormal; Notable for the following:       Result Value  APPearance CLOUDY (*)    All other components within normal limits  CBC - Abnormal; Notable for the following:    WBC 3.9 (*)    Hemoglobin 8.2 (*)    HCT 27.9 (*)    MCV 56.3 (*)    MCH 16.5 (*)    MCHC 29.4 (*)    RDW 21.6 (*)    Platelets 428 (*)    All other components within normal limits  PREGNANCY, URINE  COMPREHENSIVE METABOLIC PANEL  LIPASE, BLOOD    EKG  EKG Interpretation None       Radiology Ct Abdomen Pelvis W Contrast  Result Date:  03/10/2016 CLINICAL DATA:  Generalized intermittent abdominal pain since Monday after eating ice cream. Nausea and vomiting. No lactose tolerance EXAM: CT ABDOMEN AND PELVIS WITH CONTRAST TECHNIQUE: Multidetector CT imaging of the abdomen and pelvis was performed using the standard protocol following bolus administration of intravenous contrast. CONTRAST:  133mL ISOVUE-300 IOPAMIDOL (ISOVUE-300) INJECTION 61% COMPARISON:  None. FINDINGS: Lower chest: Normal visualized cardiac chamber size. Clear lung bases. Hepatobiliary: 4 mm hypodensity in the right hepatic lobe series 2, image 35 too small to further characterize but more commonly associated with tiny cysts or hemangiomata. Pancreas: Unremarkable. No pancreatic ductal dilatation or surrounding inflammatory changes. Spleen: Normal in size without focal abnormality. Adrenals/Urinary Tract: Adrenal glands are unremarkable. Kidneys are normal, without renal calculi, focal lesion, or hydronephrosis. Bladder is unremarkable. Stomach/Bowel: Stomach is within normal limits. Appendix is top-normal in caliber with tiny hyperdensity near its tip consistent with an appendicolith. No evidence of bowel wall thickening, distention, or inflammatory changes. Vascular/Lymphatic: No significant vascular findings are present. No enlarged abdominal or pelvic lymph nodes. Single phlebolith in the left hemipelvis. Reproductive: The uterus is bulky in appearance with numerous fibroids numbering at least half a dozen, the largest is infraumbilical, submucosal and midline off the fundus measuring 9.5 cm in diameter. Projecting off this fundal fibroid is a similar sized 9.5 cm in diameter fibroid with central areas of hypodensity suggesting areas of internal necrosis. This projects into the right hemiabdomen to approximately 6 cm above the level of the umbilicus. Variable sized smaller fibroids are identified with a more notable circumferentially calcified hypodense fibroid which appears  predominantly intramural with small superomedial submucosal component measuring 5.9 x 4.6 cm x 4.8cm in AP by transverse by craniocaudal dimension. Small follicles are seen of both ovaries. Other: Trace fluid adjacent to the right ovary and appendix. Musculoskeletal: Moderate broad-based L4-5 partially calcified disc bulge and L5-S1 central to intraforaminal partially calcified disc bulge. Mild neural foraminal narrowing at L5-S1 bilaterally. IMPRESSION: There are least half a dozen uterine fibroids are identified as above described the largest project off the fundus and are estimated at 9.5 cm in diameter each. One which projects into the right hemi abdomen and also measures 9.5 cm in diameter demonstrates areas of internal hypodensity suggesting central necrosis. No acute inflammatory process or bowel obstruction. Top normal sized appendix. Partially calcified disc bulges at L4-5 and L5-S1. Electronically Signed   By: Ashley Royalty M.D.   On: 03/10/2016 14:01    Procedures Procedures (including critical care time)  Medications Ordered in ED Medications  0.9 %  sodium chloride infusion (not administered)  ondansetron (ZOFRAN) injection 4 mg (4 mg Intravenous Given 03/10/16 1247)  sodium chloride 0.9 % bolus 1,000 mL (1,000 mLs Intravenous New Bag/Given 03/10/16 1245)  HYDROmorphone (DILAUDID) injection 1 mg (1 mg Intravenous Given 03/10/16 1249)  iopamidol (ISOVUE-300) 61 % injection 100  mL (100 mLs Intravenous Contrast Given 03/10/16 1325)     Initial Impression / Assessment and Plan / ED Course  I have reviewed the triage vital signs and the nursing notes.  Pertinent labs & imaging results that were available during my care of the patient were reviewed by me and considered in my medical decision making (see chart for details).  Clinical Course    Patient's presenting symptoms seem to be consistent with like a gastroenteritis right [place]. With epigastric abdominal pain nausea vomiting and diarrhea.  Symptoms have been present since Monday. Workup here showed evidence of an anemia no blood in her bowel movements that she is familiar with but she's had a long history of heavy periods that may be related to that. In addition CT of her abdomen and pelvis only showed uterine fibroids no other problems. Patient's pain completely resolved with pain medication and antinausea medicine here. Patient having difficulty running electrolytes and liver function test on her but since the CT was negative I went ahead and canceled them we do not need to wait for that since patient is better and CT had no findings in the epigastric area. Patient will be treated symptomatically for the gastroenteritis and she'll follow-up with her GYN doctor regarding the anemia.  Final Clinical Impressions(s) / ED Diagnoses   Final diagnoses:  Epigastric pain  Anemia, unspecified anemia type  Gastroenteritis    New Prescriptions New Prescriptions   HYDROCODONE-ACETAMINOPHEN (NORCO/VICODIN) 5-325 MG TABLET    Take 1-2 tablets by mouth every 6 (six) hours as needed for moderate pain.   PROMETHAZINE (PHENERGAN) 25 MG TABLET    Take 1 tablet (25 mg total) by mouth every 6 (six) hours as needed for nausea or vomiting.     Fredia Sorrow, MD 03/10/16 1455

## 2016-03-10 NOTE — ED Triage Notes (Signed)
Generalized intermittent abdominal pain since Monday after eating ice cream.  No lactose intolerance.   Pt did state she had some N/V.

## 2016-05-24 ENCOUNTER — Encounter (HOSPITAL_BASED_OUTPATIENT_CLINIC_OR_DEPARTMENT_OTHER): Payer: Self-pay

## 2016-05-24 ENCOUNTER — Emergency Department (HOSPITAL_BASED_OUTPATIENT_CLINIC_OR_DEPARTMENT_OTHER): Payer: 59

## 2016-05-24 ENCOUNTER — Emergency Department (HOSPITAL_BASED_OUTPATIENT_CLINIC_OR_DEPARTMENT_OTHER)
Admission: EM | Admit: 2016-05-24 | Discharge: 2016-05-24 | Disposition: A | Discharge status: Home / Self Care | Payer: 59 | Attending: Emergency Medicine | Admitting: Emergency Medicine

## 2016-05-24 DIAGNOSIS — R102 Pelvic and perineal pain: Secondary | ICD-10-CM

## 2016-05-24 DIAGNOSIS — D259 Leiomyoma of uterus, unspecified: Secondary | ICD-10-CM

## 2016-05-24 DIAGNOSIS — Z87891 Personal history of nicotine dependence: Secondary | ICD-10-CM | POA: Diagnosis not present

## 2016-05-24 DIAGNOSIS — N3000 Acute cystitis without hematuria: Secondary | ICD-10-CM | POA: Insufficient documentation

## 2016-05-24 HISTORY — DX: Anemia, unspecified: D64.9

## 2016-05-24 HISTORY — DX: Benign neoplasm of connective and other soft tissue, unspecified: D21.9

## 2016-05-24 LAB — CBC WITH DIFFERENTIAL/PLATELET
Basophils Absolute: 0 10*3/uL (ref 0.0–0.1)
Basophils Relative: 0 %
EOS ABS: 0 10*3/uL (ref 0.0–0.7)
EOS PCT: 0 %
HEMATOCRIT: 28.1 % — AB (ref 36.0–46.0)
HEMOGLOBIN: 8.1 g/dL — AB (ref 12.0–15.0)
LYMPHS ABS: 2.1 10*3/uL (ref 0.7–4.0)
LYMPHS PCT: 34 %
MCH: 17.5 pg — ABNORMAL LOW (ref 26.0–34.0)
MCHC: 28.8 g/dL — AB (ref 30.0–36.0)
MCV: 60.8 fL — AB (ref 78.0–100.0)
MONO ABS: 0.7 10*3/uL (ref 0.1–1.0)
MONOS PCT: 11 %
Neutro Abs: 3.5 10*3/uL (ref 1.7–7.7)
Neutrophils Relative %: 55 %
Platelets: 501 10*3/uL — ABNORMAL HIGH (ref 150–400)
RBC: 4.62 MIL/uL (ref 3.87–5.11)
RDW: 25.5 % — AB (ref 11.5–15.5)
WBC: 6.3 10*3/uL (ref 4.0–10.5)

## 2016-05-24 LAB — COMPREHENSIVE METABOLIC PANEL
ALK PHOS: 78 U/L (ref 38–126)
ALT: 18 U/L (ref 14–54)
ANION GAP: 7 (ref 5–15)
AST: 21 U/L (ref 15–41)
Albumin: 3.4 g/dL — ABNORMAL LOW (ref 3.5–5.0)
BILIRUBIN TOTAL: 0.2 mg/dL — AB (ref 0.3–1.2)
BUN: 9 mg/dL (ref 6–20)
CALCIUM: 9.5 mg/dL (ref 8.9–10.3)
CO2: 23 mmol/L (ref 22–32)
Chloride: 111 mmol/L (ref 101–111)
Creatinine, Ser: 0.42 mg/dL — ABNORMAL LOW (ref 0.44–1.00)
GFR calc Af Amer: >60 mL/min (ref >60)
GLUCOSE: 98 mg/dL (ref 65–99)
POTASSIUM: 3.5 mmol/L (ref 3.5–5.1)
Sodium: 141 mmol/L (ref 135–145)
TOTAL PROTEIN: 8.1 g/dL (ref 6.5–8.1)

## 2016-05-24 LAB — URINALYSIS, MICROSCOPIC (REFLEX)

## 2016-05-24 LAB — URINALYSIS, ROUTINE W REFLEX MICROSCOPIC
BILIRUBIN URINE: NEGATIVE
GLUCOSE, UA: NEGATIVE mg/dL
Ketones, ur: NEGATIVE mg/dL
Nitrite: NEGATIVE
PH: 6 (ref 5.0–8.0)
Protein, ur: NEGATIVE mg/dL
SPECIFIC GRAVITY, URINE: 1.018 (ref 1.005–1.030)

## 2016-05-24 LAB — I-STAT CG4 LACTIC ACID, ED: LACTIC ACID, VENOUS: 1.05 mmol/L (ref 0.5–1.9)

## 2016-05-24 LAB — HCG, SERUM, QUALITATIVE: PREG SERUM: NEGATIVE

## 2016-05-24 LAB — LIPASE, BLOOD: Lipase: 16 U/L (ref 11–51)

## 2016-05-24 MED ORDER — MORPHINE SULFATE (PF) 4 MG/ML IV SOLN
4.0000 mg | Freq: Once | INTRAVENOUS | Status: AC
  Administered 2016-05-24: 4 mg via INTRAVENOUS
  Filled 2016-05-24: qty 1

## 2016-05-24 MED ORDER — OXYCODONE-ACETAMINOPHEN 5-325 MG PO TABS: 1.0000 | ORAL_TABLET | Freq: Four times a day (QID) | ORAL | 0 refills | Status: AC | PRN

## 2016-05-24 MED ORDER — CEPHALEXIN 500 MG PO CAPS: 500.0000 mg | ORAL_CAPSULE | Freq: Two times a day (BID) | ORAL | 0 refills | Status: AC

## 2016-05-24 MED ORDER — SODIUM CHLORIDE 0.9 % IV BOLUS (SEPSIS)
1000.0000 mL | Freq: Once | INTRAVENOUS | Status: AC
  Administered 2016-05-24: 1000 mL via INTRAVENOUS

## 2016-05-24 MED ORDER — HYDROMORPHONE HCL 1 MG/ML IJ SOLN
1.0000 mg | Freq: Once | INTRAMUSCULAR | Status: AC
  Administered 2016-05-24: 1 mg via INTRAVENOUS
  Filled 2016-05-24: qty 1

## 2016-05-24 MED ORDER — ONDANSETRON HCL 4 MG PO TABS: 4.0000 mg | ORAL_TABLET | Freq: Three times a day (TID) | ORAL | 0 refills | Status: AC | PRN

## 2016-05-24 MED FILL — OXYCODONE/APAP 5-325: 5-325 | 5 days supply | Qty: 20 | Fill #0

## 2016-05-24 MED FILL — CEPHALEXIN 500 MG CAPSULE: 500 | 7 days supply | Qty: 14 | Fill #0

## 2016-05-24 MED FILL — ONDANSETRON HCL 4 MG TABLET: 4 | 4 days supply | Qty: 12 | Fill #0

## 2016-05-24 NOTE — ED Notes (Signed)
Pt ambulatory with steady gait at discharge. Directed to pharmacy to pick up rx. Work note given

## 2016-05-24 NOTE — ED Triage Notes (Signed)
Pt c/o bilateral pelvic pain that radiates down her legs as well as bilateral lower back pain since last night.  Pt has been taking hydrocodone without relief.

## 2016-05-24 NOTE — ED Notes (Signed)
ED Provider at bedside. 

## 2016-05-24 NOTE — ED Notes (Signed)
Pt was unable to provide urine sample in triage.

## 2016-05-24 NOTE — ED Provider Notes (Signed)
[facility] Provider Note   CSN: [id number] Arrival date & time: 05/24/16  [id number]     History   Chief Complaint Chief Complaint  Patient presents with  . Pelvic Pain    HPI Dana Merritt is a 30 y.o. female with a past medical history significant for hyperthyroidism, chronic anemia, and arch uterine fibroids who presents with abdominal and pelvic pain. Patient reports that her pain is similar to pain she had when she was diagnosed with large fibroids. She reports that her gynecologist recommended surgery for removal however, patient decided to try medical therapy initially. Patient says that she has been taking birth control, iron pills, and some intermittent pain medicine to help her symptoms. She reports that she is currently on her menstrual cycle and the pain worsened yesterday. She says that she is unable to get comfortable or sleep due to the lower abdominal pain. She describes the pain as up to a 10 out of 10 in severity and radiating into her back and down her legs. Patient says that hydrocodone at home has not helped in the past so she did not take it today. Patient denies any vaginal discharge but does report her menstrual cycle has begun. She denies it being different than normal. She denies any fevers, chills, chest pain, shortness of breath, nausea, vomiting, constipation, diarrhea, or dysuria. She denies any recent abdominal trauma. She denies any other symptoms on arrival.    The history is provided by the patient and medical records. No language interpreter was used.  Abdominal Pain   This is a recurrent problem. The current episode started 2 days ago. The problem occurs constantly. The problem has not changed since onset.Associated with: fibroids. The pain is located in the suprapubic region. The quality of the pain is aching, cramping and sharp. The pain is at a severity of 10/10. The pain is severe. Associated symptoms include nausea and vomiting. Pertinent  negatives include fever, diarrhea, constipation, dysuria, frequency, hematuria and headaches. The symptoms are aggravated by palpation and certain positions. Nothing relieves the symptoms. Past workup includes CT scan.    Past Medical History:  Diagnosis Date  . Anemia   . Fibroids   . Hyperthyroidism     Patient Active Problem List   Diagnosis Date Noted  . Hyperthyroidism 10/24/2012    History reviewed. No pertinent surgical history.  OB History    No data available       Home Medications    Prior to Admission medications   Medication Sig Start Date End Date Taking? Authorizing Provider  HYDROcodone-acetaminophen (NORCO/VICODIN) 5-325 MG tablet Take 1-2 tablets by mouth every 6 (six) hours as needed for moderate pain. 03/10/16   Fredia Sorrow, MD  promethazine (PHENERGAN) 25 MG tablet Take 1 tablet (25 mg total) by mouth every 6 (six) hours as needed for nausea or vomiting. 03/10/16   Fredia Sorrow, MD    Family History No family history on file.  Social History Social History  Substance Use Topics  . Smoking status: Former Research scientist (life sciences)  . Smokeless tobacco: Never Used  . Alcohol use Yes     Comment: occ     Allergies   Patient has no known allergies.   Review of Systems Review of Systems  Constitutional: Negative for activity change, chills, diaphoresis, fatigue and fever.  HENT: Negative for congestion and rhinorrhea.   Eyes: Negative for visual disturbance.  Respiratory: Negative for cough, chest tightness, shortness of breath and stridor.   Cardiovascular: Negative  for chest pain, palpitations and leg swelling.  Gastrointestinal: Positive for abdominal pain, nausea and vomiting. Negative for abdominal distention, blood in stool, constipation and diarrhea.  Genitourinary: Negative for difficulty urinating, dysuria, frequency, hematuria, menstrual problem, pelvic pain, vaginal bleeding and vaginal discharge.  Musculoskeletal: Negative for back pain and neck  pain.  Skin: Negative for rash and wound.  Neurological: Negative for dizziness, weakness, light-headedness, numbness and headaches.  Psychiatric/Behavioral: Negative for agitation and confusion.  All other systems reviewed and are negative.    Physical Exam Updated Vital Signs BP 153/93 (BP Location: Right Arm)   Pulse 113   Temp 98.2 F (36.8 C) (Oral)   Resp 18   Ht 5\' 4"  (1.626 m)   Wt 185 lb (83.9 kg)   LMP 05/19/2016   SpO2 98%   BMI 31.76 kg/m   Physical Exam  Constitutional: She is oriented to person, place, and time. She appears well-developed and well-nourished. No distress.  HENT:  Head: Normocephalic and atraumatic.  Mouth/Throat: Oropharynx is clear and moist.  Eyes: Conjunctivae are normal.  Neck: Neck supple.  Cardiovascular: Normal rate and regular rhythm.   No murmur heard. Pulmonary/Chest: Effort normal and breath sounds normal. No respiratory distress. She exhibits no tenderness.  Abdominal: Soft. Normal appearance and bowel sounds are normal. There is tenderness in the suprapubic area. There is no rigidity, no rebound, no guarding and no CVA tenderness.    Musculoskeletal: She exhibits no edema or tenderness.  Neurological: She is alert and oriented to person, place, and time. No sensory deficit.  Skin: Skin is warm and dry. Capillary refill takes less than 2 seconds. No rash noted.  Psychiatric: She has a normal mood and affect.  Nursing note and vitals reviewed.    ED Treatments / Results  Labs (all labs ordered are listed, but only abnormal results are displayed) Labs Reviewed  URINALYSIS, ROUTINE W REFLEX MICROSCOPIC - Abnormal; Notable for the following:       Result Value   APPearance CLOUDY (*)    Hgb urine dipstick LARGE (*)    Leukocytes, UA SMALL (*)    All other components within normal limits  CBC WITH DIFFERENTIAL/PLATELET - Abnormal; Notable for the following:    Hemoglobin 8.1 (*)    HCT 28.1 (*)    MCV 60.8 (*)    MCH 17.5  (*)    MCHC 28.8 (*)    RDW 25.5 (*)    Platelets 501 (*)    All other components within normal limits  COMPREHENSIVE METABOLIC PANEL - Abnormal; Notable for the following:    Creatinine, Ser 0.42 (*)    Albumin 3.4 (*)    Total Bilirubin 0.2 (*)    All other components within normal limits  URINALYSIS, MICROSCOPIC (REFLEX) - Abnormal; Notable for the following:    Bacteria, UA MANY (*)    Squamous Epithelial / LPF 0-5 (*)    All other components within normal limits  LIPASE, BLOOD  HCG, SERUM, QUALITATIVE  I-STAT CG4 LACTIC ACID, ED    EKG  EKG Interpretation None       Radiology US Pelvis Complete  Result Date: 05/24/2016 CLINICAL DATA:  Pelvic pain, history pelvic fibroids EXAM: TRANSABDOMINAL ULTRASOUND OF PELVIS TECHNIQUE: Transabdominal ultrasound examination of the pelvis was performed including evaluation of the uterus, ovaries, adnexal regions, and pelvic cul-de-sac. Patient refused transvaginal imaging. COMPARISON:  CT abdomen and pelvis 03/10/2016 FINDINGS: Uterus Measurements: 20.4 x 11.0 x 11.0. Multiple uterine leiomyomata, largest measuring 7.1 x  6.8 x 7.1 cm at fundus and 6.1 x 5.8 x 6.2 cm at the anterior uterus. On a prior CT, additional large leiomyoma is seen arising from the lateral RIGHT frontal region and extending into the RIGHT mid abdomen, 9.5 x 8.0 x 10.6 cm, not discretely measured on this exam. Endometrium Thickness: Unable to accurately visualize due to superior spur distortion by numerous uterine leiomyomata. Right ovary Measurements: 3.1 x 2.0 x 4.9 cm. No definite ovarian mass. Left ovary Measurements: 3.2 x 1.9 x 1.9 cm. Normal morphology without mass Other findings: Free fluid adjacent to RIGHT ovary versus poorly defined cystic structure, 4 cm maximum dimension. IMPRESSION: Markedly enlarged uterus containing multiple uterine leiomyomata. Small amount of free fluid in RIGHT pelvis adjacent to RIGHT ovary versus a poorly defined paraovarian cystic  structure 4 cm diameter ; this appears slightly larger than the 2.3 cm diameter area of fluid attenuation adjacent to RIGHT ovary on the previous CT exam. This potential cystic collection is poorly characterized by both CT and [place] ; recommend followup MR pelvis assessment to determine whether this represents loculated free fluid or a complex cystic paraovarian mass. Electronically Signed   By: Lavonia Dana M.D.   On: 05/24/2016 10:10    Procedures Procedures (including critical care time)  Medications Ordered in ED Medications  sodium chloride 0.9 % bolus 1,000 mL (0 mLs Intravenous Stopped 05/24/16 0827)  morphine 4 MG/ML injection 4 mg (4 mg Intravenous Given 05/24/16 0728)  HYDROmorphone (DILAUDID) injection 1 mg (1 mg Intravenous Given 05/24/16 0832)     Initial Impression / Assessment and Plan / ED Course  I have reviewed the triage vital signs and the nursing notes.  Pertinent labs & imaging results that were available during my care of the patient were reviewed by me and considered in my medical decision making (see chart for details).  Clinical Course     Dana Merritt is a 30 y.o. female with a past medical history significant for hyperthyroidism, chronic anemia, and arch uterine fibroids who presents with abdominal and pelvic pain.  History and exam are seen above.  On exam, patient has lower abdominal tenderness to palpation. Patient has no back tenderness. Patient has clear lung sounds. Patient has no focal neurologic deficits. No leg swelling or leg tenderness. Patient is alert and oriented.  Review of patient's chart shows that recent diagnosis of uterine fibroids on CT scan showed multiple 9 cm lesions. In the setting of an stromal cycle, suspect this is the etiology of her pain. As large as they are, suspect patient will need ultrasound to evaluate possible growth. Patient will be given pain medicine and fluids and we'll also workup to look for UTI or other  intra-abdominal abnormality.  Diagnostic testing returned showing unchanged hemoglobin from prior. 2 months ago, patient was at 8.2 and is now 8.1 despite being on her menstrual cycle. Electrolytes are grossly unremarkable and kidney function is normal. Normal LFTs. Nonelevated lipase. Pregnancy test negative.  Given reassuring lab testing, suspect fibroids as cause of pain. Patient will have ultrasound to evaluate for growth in size of fibroids or other abnormality. Patient had mild relief with morphine, will be given Dilaudid.  Diagnostic workup results are seen above. Patient found to have evidence of urinary tract infection. Feel this might exacerbate her fibroid pain in setting of menstrual cycle. Patient given Keflex prescription.   Ultrasound imaging revealed large uterus. Evidence of small amount of free fluid in the right pelvis near the right  ovary. Evidence of a poorly defined cystic structure and you're right ovary. This is slightly larger than previously seen on CT imaging. Radiology recommended follow-up MR imaging to evaluate. Suspect fluid is secondary to menstrual cycle. Large leimyoma seen on prior CT imaging extending into the right abdomen was not discreetly seen on this exam.  Patient reported her symptoms were improving after medications. Suspect fibroid pain in setting of menstrual cycle with concomitant UTI is etiology of pain. Patient given instructions to follow up with GYN team for further management as was PCP. Patient given strict return precautions for any new or worsening symptoms. Patient drove herself to the emergency department and wanted to drive her cell phone. Thus, patient was not given another dose of pain medicine just before leaving. Suspect recent pain was cause of increase in heart rate before discharge.  Shared decision-making conversation held with patient. Patient offered to be given more pain medicine and more fluids to watch heart rate go down however,  patient feels that she gets her pain medicine at home, she will feel better. Patient continued to be given strict return precautions for return. Patient understood and was discharged in good condition.  Final Clinical Impressions(s) / ED Diagnoses   Final diagnoses:  Pelvic pain in female  Acute cystitis without hematuria    New Prescriptions Discharge Medication List as of 05/24/2016 11:35 AM    START taking these medications   Details  cephALEXin (KEFLEX) 500 MG capsule Take 1 capsule (500 mg total) by mouth 2 (two) times daily., Starting Tue 05/24/2016, Until Tue 05/31/2016, Print    ondansetron (ZOFRAN) 4 MG tablet Take 1 tablet (4 mg total) by mouth every 8 (eight) hours as needed for nausea or vomiting., Starting Tue 05/24/2016, Print    oxyCODONE-acetaminophen (PERCOCET/ROXICET) 5-325 MG tablet Take 1 tablet by mouth every 6 (six) hours as needed for severe pain., Starting Tue 05/24/2016, Print       Clinical Impression: 1. Acute cystitis without hematuria   2. Pelvic pain in female   3. Uterine leiomyoma, unspecified location     Disposition: Discharge  Condition: Good  I have discussed the results, Dx and Tx plan with the pt(& family if present). He/she/they expressed understanding and agree(s) with the plan. Discharge instructions discussed at great length. Strict return precautions discussed and pt &/or family have verbalized understanding of the instructions. No further questions at time of discharge.    Discharge Medication List as of 05/24/2016 11:35 AM    START taking these medications   Details  cephALEXin (KEFLEX) 500 MG capsule Take 1 capsule (500 mg total) by mouth 2 (two) times daily., Starting Tue 05/24/2016, Until Tue 05/31/2016, Print    ondansetron (ZOFRAN) 4 MG tablet Take 1 tablet (4 mg total) by mouth every 8 (eight) hours as needed for nausea or vomiting., Starting Tue 05/24/2016, Print    oxyCODONE-acetaminophen (PERCOCET/ROXICET) 5-325 MG  tablet Take 1 tablet by mouth every 6 (six) hours as needed for severe pain., Starting Tue 05/24/2016, Print        Follow Up: [facility] [address] [place] [phone] Schedule an appointment as soon as possible for a visit    [place] [address] [id number] [place] [address] [place] [phone]  If symptoms worsen     Courtney Paris, MD 05/24/16 2115

## 2016-05-24 NOTE — Discharge Instructions (Signed)
Please follow-up with your OB/GYN for further management of your uterine pain. If symptoms do not improve at home, please return to the nearest emergency department for further management. Please take your nausea medicine and pain medicine as needed for her symptoms. Please take your antibiotics for the urinary tract infection.

## 2016-05-24 NOTE — ED Notes (Signed)
Dr Tegeler advised of pt's heartrate of 124. Pt offered po fluids per EDP. Will re-eval at 1240.

## 2016-05-27 ENCOUNTER — Encounter (HOSPITAL_BASED_OUTPATIENT_CLINIC_OR_DEPARTMENT_OTHER): Payer: Self-pay | Admitting: *Deleted

## 2016-05-27 ENCOUNTER — Emergency Department (HOSPITAL_BASED_OUTPATIENT_CLINIC_OR_DEPARTMENT_OTHER)
Admission: EM | Admit: 2016-05-27 | Discharge: 2016-05-27 | Disposition: A | Discharge status: Home / Self Care | Payer: 59 | Attending: Emergency Medicine | Admitting: Emergency Medicine

## 2016-05-27 DIAGNOSIS — Z87891 Personal history of nicotine dependence: Secondary | ICD-10-CM | POA: Insufficient documentation

## 2016-05-27 DIAGNOSIS — Z8542 Personal history of malignant neoplasm of other parts of uterus: Secondary | ICD-10-CM | POA: Insufficient documentation

## 2016-05-27 DIAGNOSIS — Z79899 Other long term (current) drug therapy: Secondary | ICD-10-CM | POA: Insufficient documentation

## 2016-05-27 DIAGNOSIS — Z86018 Personal history of other benign neoplasm: Secondary | ICD-10-CM

## 2016-05-27 DIAGNOSIS — R1030 Lower abdominal pain, unspecified: Secondary | ICD-10-CM | POA: Diagnosis present

## 2016-05-27 MED ORDER — IBUPROFEN 800 MG PO TABS
800.0000 mg | ORAL_TABLET | Freq: Once | ORAL | Status: AC
  Administered 2016-05-27: 800 mg via ORAL
  Filled 2016-05-27: qty 1

## 2016-05-27 MED ORDER — IBUPROFEN 800 MG PO TABS: 800.0000 mg | ORAL_TABLET | Freq: Four times a day (QID) | ORAL | 0 refills | Status: AC

## 2016-05-27 MED FILL — IBUPROFEN 800 MG TABLET: 800 | 7 days supply | Qty: 28 | Fill #0

## 2016-05-27 NOTE — ED Triage Notes (Signed)
Abdominal pain. States she was seen here 3 days ago and ran out of medications. She called her MD but she would not give her a rx for pain medication.

## 2016-05-27 NOTE — ED Provider Notes (Signed)
[facility] Provider Note   CSN: [id number] Arrival date & time: 05/27/16  1249     History   Chief Complaint Chief Complaint  Patient presents with  . Abdominal Pain    HPI Dana Merritt is a 30 y.o. female.  The history is provided by the patient.  Abdominal Pain   This is a chronic problem. The current episode started more than 1 week ago. The problem occurs constantly. The problem has not changed since onset.Associated with: utering fibroid. The pain is located in the suprapubic region. The quality of the pain is aching, cramping and sharp. The pain is moderate. Pertinent negatives include fever, hematochezia, melena, vomiting and constipation. Nothing aggravates the symptoms. Nothing relieves the symptoms.    Past Medical History:  Diagnosis Date  . Anemia   . Fibroids   . Hyperthyroidism     Patient Active Problem List   Diagnosis Date Noted  . Hyperthyroidism 10/24/2012    History reviewed. No pertinent surgical history.  OB History    No data available       Home Medications    Prior to Admission medications   Medication Sig Start Date End Date Taking? Authorizing Provider  cephALEXin (KEFLEX) 500 MG capsule Take 1 capsule (500 mg total) by mouth 2 (two) times daily. 05/24/16 05/31/16  Gwenyth Allegra Tegeler, MD  HYDROcodone-acetaminophen (NORCO/VICODIN) 5-325 MG tablet Take 1-2 tablets by mouth every 6 (six) hours as needed for moderate pain. 03/10/16   Fredia Sorrow, MD  ondansetron (ZOFRAN) 4 MG tablet Take 1 tablet (4 mg total) by mouth every 8 (eight) hours as needed for nausea or vomiting. 05/24/16   Gwenyth Allegra Tegeler, MD  oxyCODONE-acetaminophen (PERCOCET/ROXICET) 5-325 MG tablet Take 1 tablet by mouth every 6 (six) hours as needed for severe pain. 05/24/16   Gwenyth Allegra Tegeler, MD  promethazine (PHENERGAN) 25 MG tablet Take 1 tablet (25 mg total) by mouth every 6 (six) hours as needed for nausea or vomiting. 03/10/16    Fredia Sorrow, MD    Family History No family history on file.  Social History Social History  Substance Use Topics  . Smoking status: Former Research scientist (life sciences)  . Smokeless tobacco: Never Used  . Alcohol use Yes     Comment: occ     Allergies   Patient has no known allergies.   Review of Systems Review of Systems  Constitutional: Negative for fever.  Gastrointestinal: Positive for abdominal pain. Negative for constipation, hematochezia, melena and vomiting.  All other systems reviewed and are negative.    Physical Exam Updated Vital Signs BP 156/79   Pulse 120   Temp 99.5 F (37.5 C) (Oral)   Resp 22   Ht 5\' 4"  (1.626 m)   Wt 185 lb (83.9 kg)   LMP 05/19/2016   SpO2 100%   BMI 31.76 kg/m   Physical Exam  Constitutional: She is oriented to person, place, and time. She appears well-developed and well-nourished. No distress.  HENT:  Head: Normocephalic.  Nose: Nose normal.  Eyes: Conjunctivae are normal.  Neck: Neck supple. No tracheal deviation present.  Cardiovascular: Normal rate and regular rhythm.   Pulmonary/Chest: Effort normal. No respiratory distress.  Abdominal: Soft. She exhibits no distension.  Neurological: She is alert and oriented to person, place, and time.  Skin: Skin is warm and dry.  Psychiatric: She has a normal mood and affect.     ED Treatments / Results  Labs (all labs ordered are listed, but only abnormal  results are displayed) Labs Reviewed - No data to display  EKG  EKG Interpretation None       Radiology No results found.  Procedures Procedures (including critical care time)  Medications Ordered in ED Medications  ibuprofen (ADVIL,MOTRIN) tablet 800 mg (not administered)     Initial Impression / Assessment and Plan / ED Course  I have reviewed the triage vital signs and the nursing notes.  Pertinent labs & imaging results that were available during my care of the patient were reviewed by me and considered in my  medical decision making (see chart for details).  Clinical Course     30 y.o. female presents with ongoing pain from uterine fibroid. She called her PCP to refill percocet and initially they wouldn't so she comes for repeat evaluation. She states she did have the percocet refilled just prior to arrival. We discussed the etiology of her pain and to her knowledge nobody had yet discussed or prescribed a trial of NSAIDs. She has had norco and percocet with minimal relief. I recommended scheduled inflammation dose ibuprofen and she can use the narcotics for breakthrough but I feel this will more appropriately treat the underlying cause. She has f/u in PCP office scheduled within the week. Plan to follow up with PCP as needed and return precautions discussed for worsening or new concerning symptoms.   Final Clinical Impressions(s) / ED Diagnoses   Final diagnoses:  History of uterine fibroid    New Prescriptions Discharge Medication List as of 05/27/2016  1:40 PM    START taking these medications   Details  ibuprofen (ADVIL,MOTRIN) 800 MG tablet Take 1 tablet (800 mg total) by mouth every 6 (six) hours., Starting Fri 05/27/2016, Until Fri 06/03/2016, Print         Leo Grosser, MD 05/27/16 [phone]

## 2016-05-27 NOTE — ED Notes (Signed)
EDP aware of pt vitals, still wishes to discharge.

## 2016-06-22 ENCOUNTER — Other Ambulatory Visit: Payer: 59

## 2016-06-22 ENCOUNTER — Ambulatory Visit: Payer: 59

## 2016-06-22 ENCOUNTER — Other Ambulatory Visit: Payer: Self-pay | Admitting: *Deleted

## 2016-06-22 DIAGNOSIS — D508 Other iron deficiency anemias: Secondary | ICD-10-CM

## 2016-10-11 IMAGING — CT CT ABD-PELV W/ CM
2 of 4 series · 16 of 46 positions shown, 18 images · IV contrast (APPLIED)
Comparison: None.

CLINICAL DATA: Generalized intermittent abdominal pain since [REDACTED]
after eating ice cream. Nausea and vomiting. No lactose tolerance

EXAM:
CT ABDOMEN AND PELVIS WITH CONTRAST
TECHNIQUE: Multidetector CT imaging of the abdomen and pelvis was performed
using the standard protocol following bolus administration of
intravenous contrast.
CONTRAST:  100mL [id number] IOPAMIDOL (RCDA6N-T66) INJECTION 61%

[Series 2: axial st · axial · 0.66mm/px · z∈[-537,-127]mm · 13 of 90 slices shown, 15 images]
[im 4/90  soft-tissue]
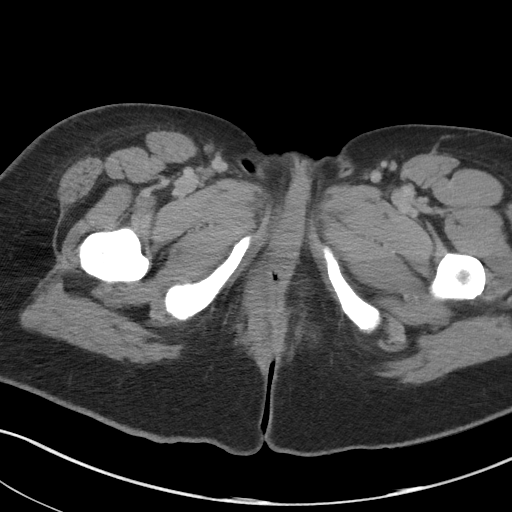
[im 4/90  bone]
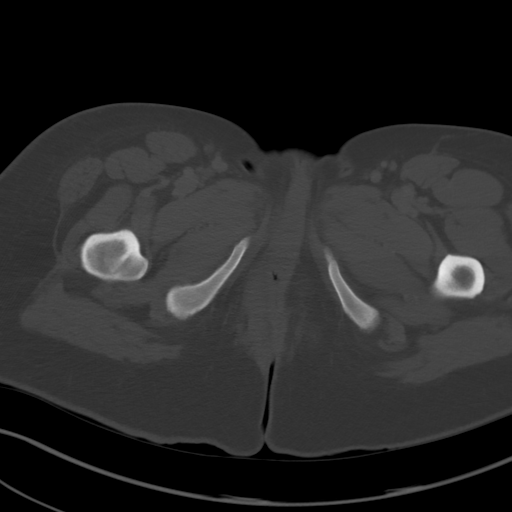
[im 11/90  soft-tissue]
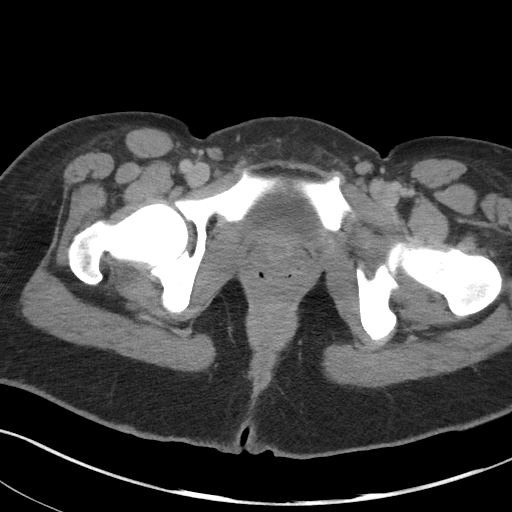
[im 18/90  soft-tissue]
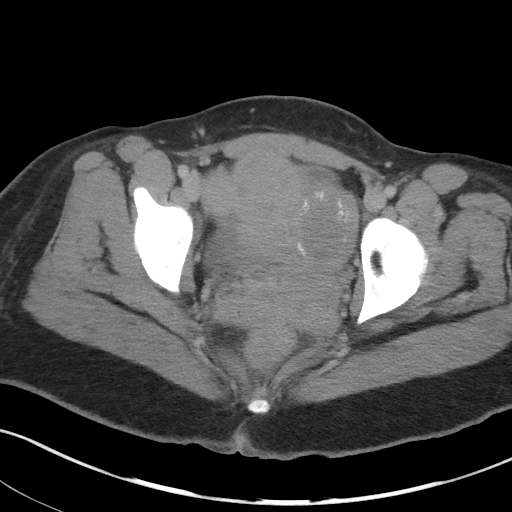
[im 25/90  soft-tissue]
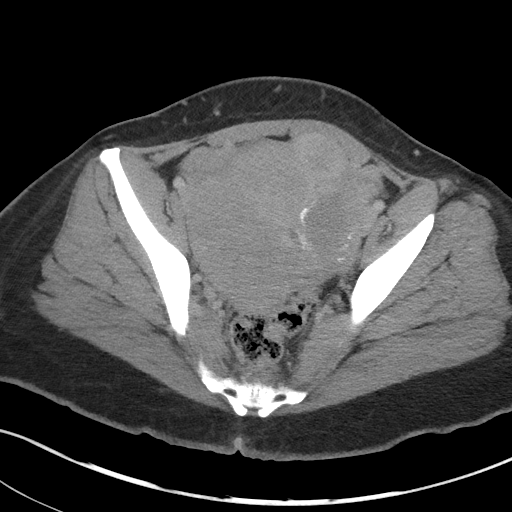
[im 33/90  soft-tissue]
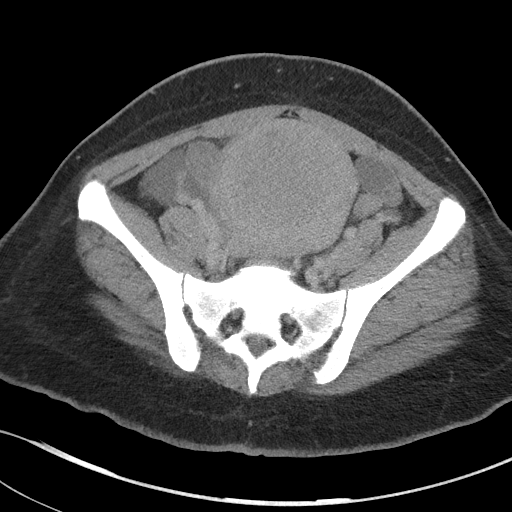
[im 40/90  soft-tissue]
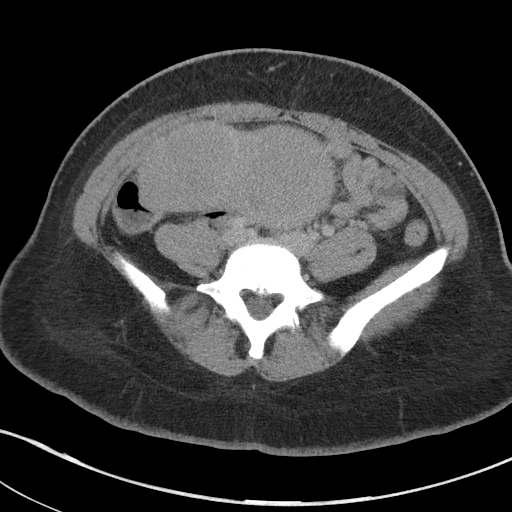
[im 47/90  soft-tissue]
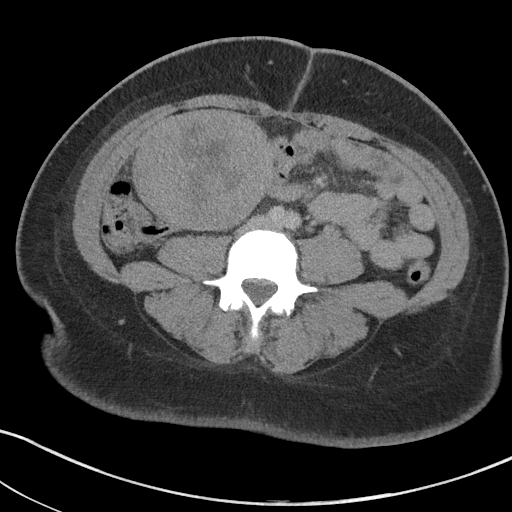
[im 50/90  soft-tissue]
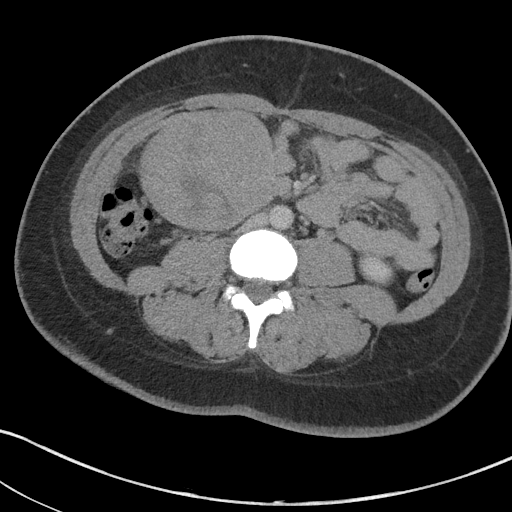
[im 57/90  soft-tissue]
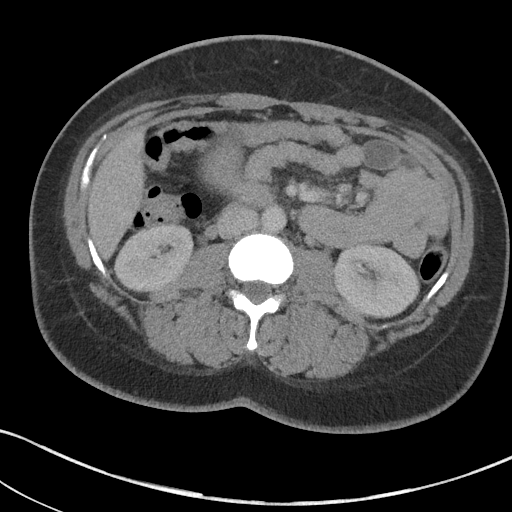
[im 57/90  bone]
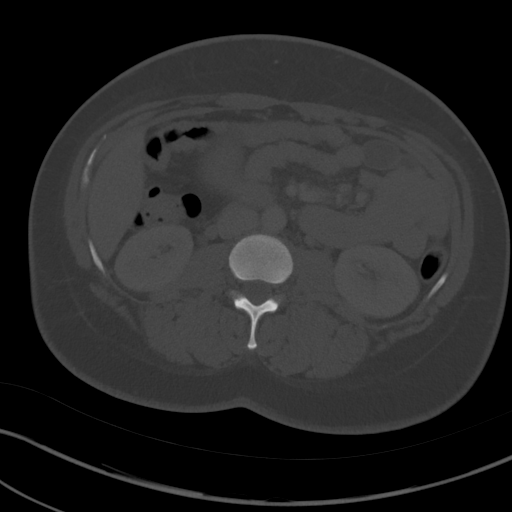
[im 65/90  soft-tissue]
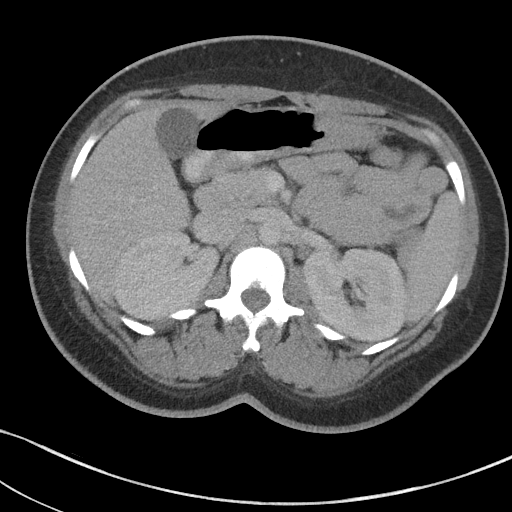
[im 72/90  soft-tissue]
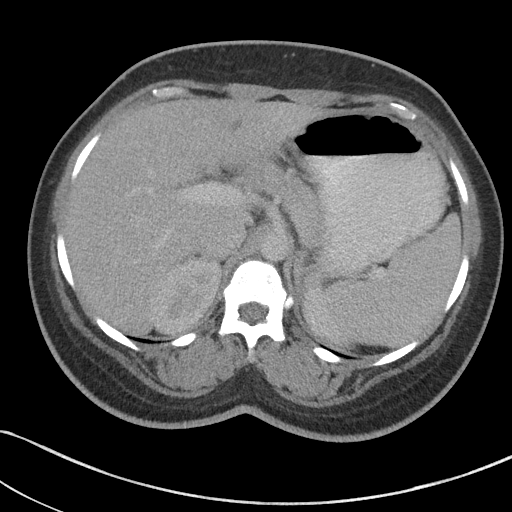
[im 79/90  soft-tissue]
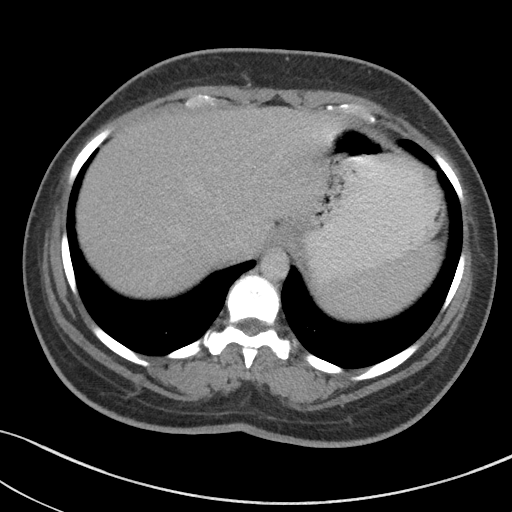
[im 86/90  soft-tissue]
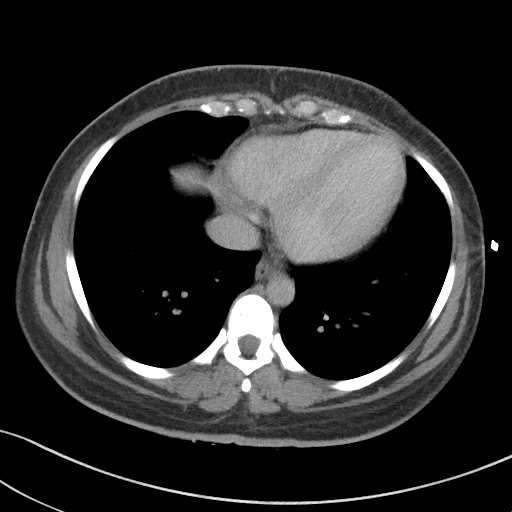

[Series 5: coronal st · coronal · 0.82mm/px · 3 of 94 slices shown]
[im 32/94  soft-tissue]
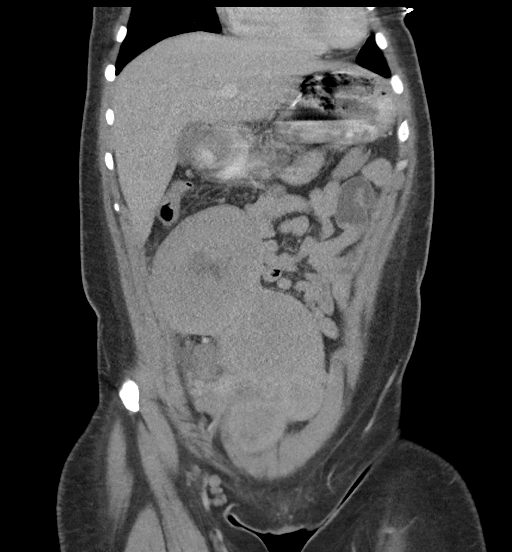
[im 42/94  soft-tissue]
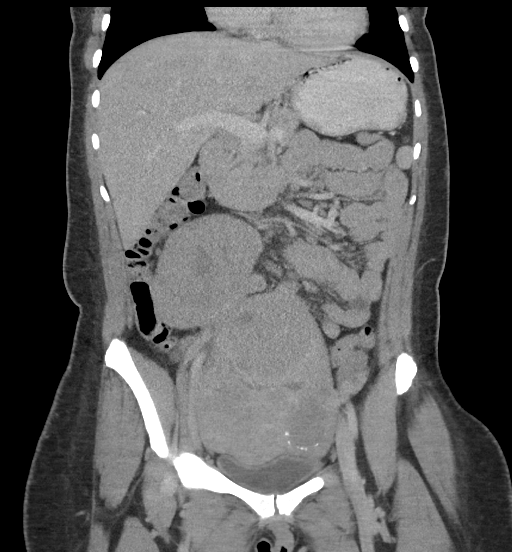
[im 52/94  soft-tissue]
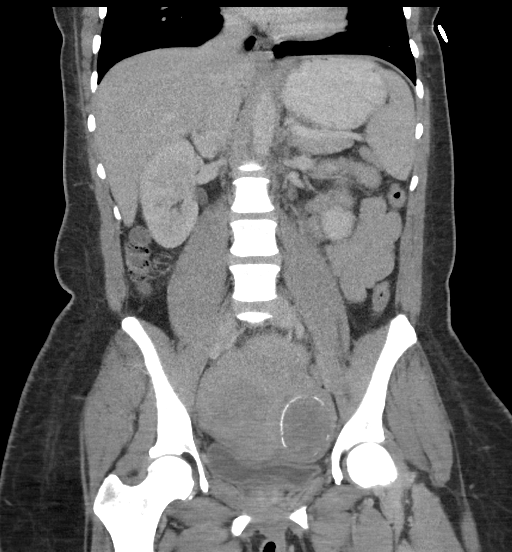

[16 of 46 positions shown; findings below may reference images not displayed]

FINDINGS: Lower chest: Normal visualized cardiac chamber size. Clear lung
bases.

Hepatobiliary: 4 mm hypodensity in the right hepatic lobe series 2,
image 35 too small to further characterize but more commonly
associated with tiny cysts or hemangiomata.

Pancreas: Unremarkable. No pancreatic ductal dilatation or
surrounding inflammatory changes.

Spleen: Normal in size without focal abnormality.

Adrenals/Urinary Tract: Adrenal glands are unremarkable. Kidneys are
normal, without renal calculi, focal lesion, or hydronephrosis.
Bladder is unremarkable.

Stomach/Bowel: Stomach is within normal limits. Appendix is
top-normal in caliber with tiny hyperdensity near its tip consistent
with an appendicolith. No evidence of bowel wall thickening,
distention, or inflammatory changes.

Vascular/Lymphatic: No significant vascular findings are present. No
enlarged abdominal or pelvic lymph nodes. Single phlebolith in the
left hemipelvis.

Reproductive: The uterus is bulky in appearance with numerous
fibroids numbering at least half a dozen, the largest is
infraumbilical, submucosal and midline off the fundus measuring
cm in diameter. Projecting off this fundal fibroid is a similar
sized 9.5 cm in diameter fibroid with central areas of hypodensity
suggesting areas of internal necrosis. This projects into the right
hemiabdomen to approximately 6 cm above the level of the umbilicus.
Variable sized smaller fibroids are identified with a more notable
circumferentially calcified hypodense fibroid which appears
predominantly intramural with small superomedial submucosal
component measuring 5.9 x 4.6 cm x 4.8cm in AP by transverse by
craniocaudal dimension. Small follicles are seen of both ovaries.

Other: Trace fluid adjacent to the right ovary and appendix.

Musculoskeletal: Moderate broad-based L4-5 partially calcified disc
bulge and L5-S1 central to intraforaminal partially calcified disc
bulge. Mild neural foraminal narrowing at L5-S1 bilaterally.
IMPRESSION: There are least half a dozen uterine fibroids are identified as
above described the largest project off the fundus and are estimated
at 9.5 cm in diameter each. One which projects into the right hemi
abdomen and also measures 9.5 cm in diameter demonstrates areas of
internal hypodensity suggesting central necrosis.

No acute inflammatory process or bowel obstruction. Top normal sized
appendix.

Partially calcified disc bulges at L4-5 and L5-S1.

## 2018-05-03 IMAGING — US US PELVIS COMPLETE
1 series · 13 of 25 positions shown · non-contrast
Comparison: CT abdomen and pelvis 03/10/2016

CLINICAL DATA: Pelvic pain, history pelvic fibroids

EXAM:
TRANSABDOMINAL ULTRASOUND OF PELVIS
TECHNIQUE: Transabdominal ultrasound examination of the pelvis was performed
including evaluation of the uterus, ovaries, adnexal regions, and
pelvic cul-de-sac. Patient refused transvaginal imaging.

[Series 1: us pelvis complete · 0.34mm/px · 32 acquisitions, 13 frames shown]
[im 1/32]
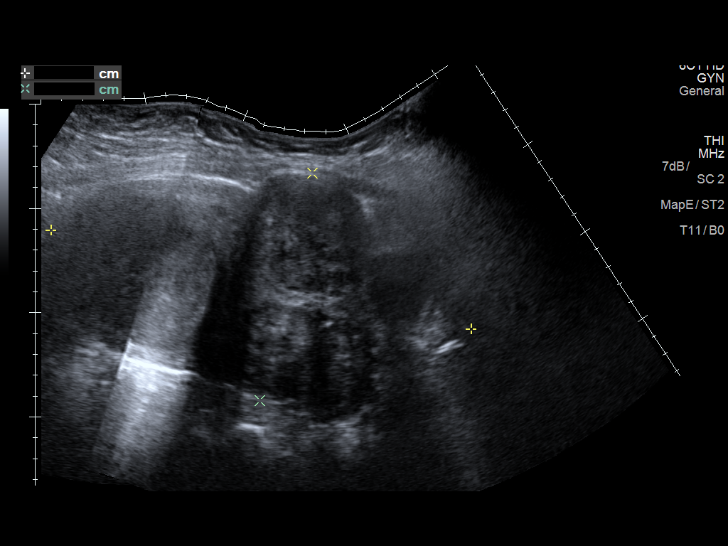
[im 3/32]
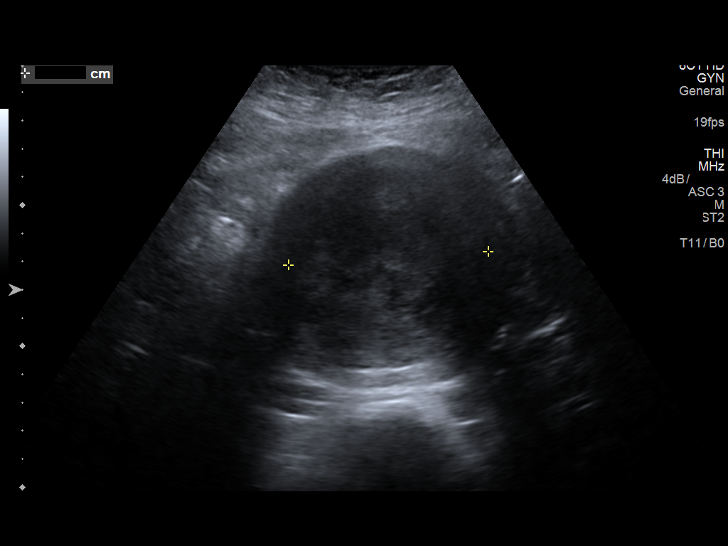
[im 6/32]
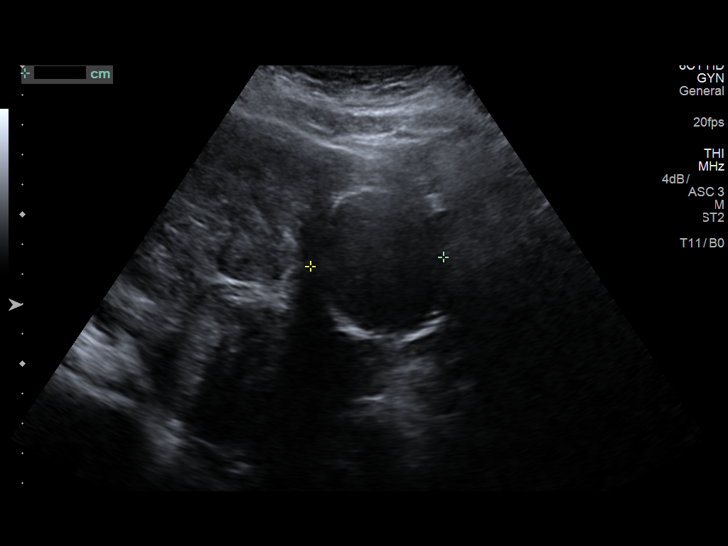
[im 8/32]
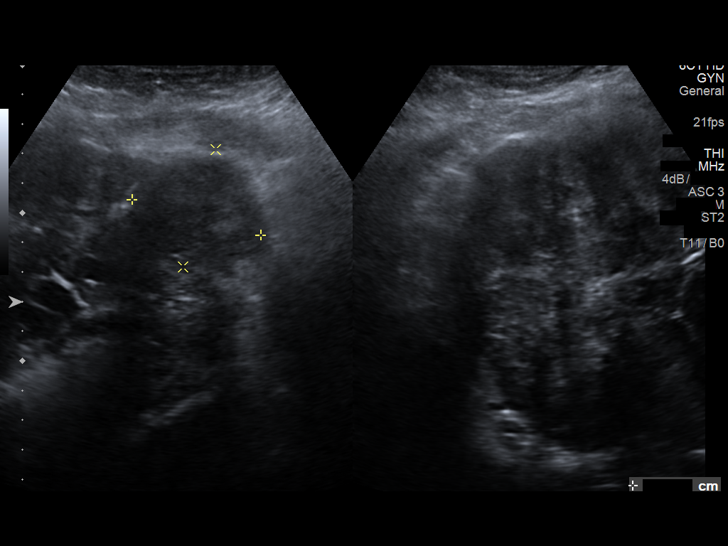
[im 11/32]
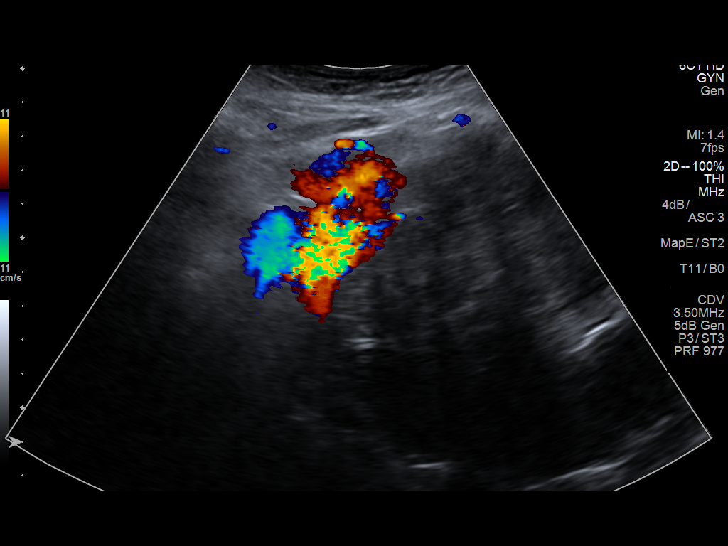
[im 13/32]
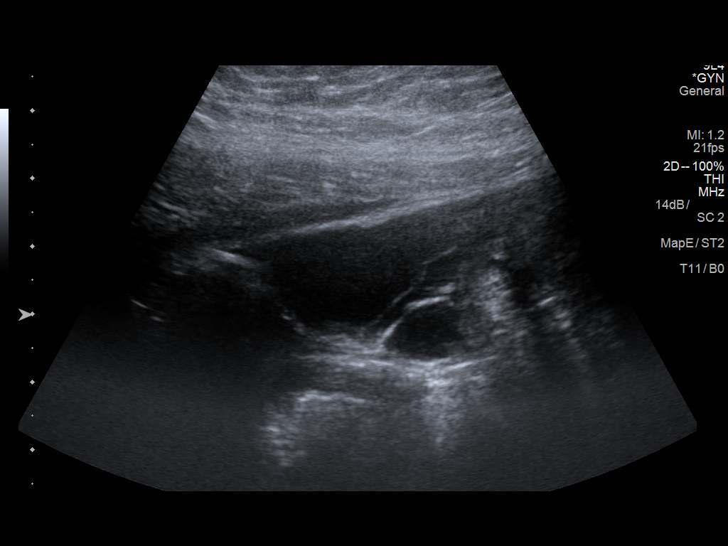
[im 16/32]
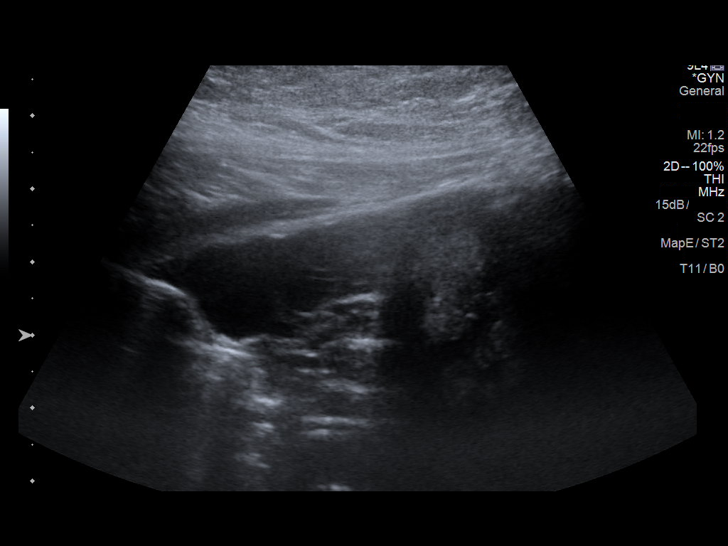
[im 19/32]
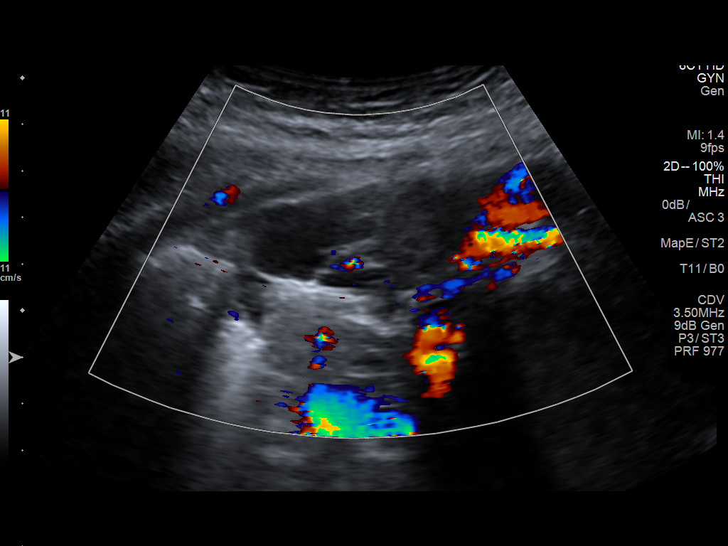
[im 21/32]
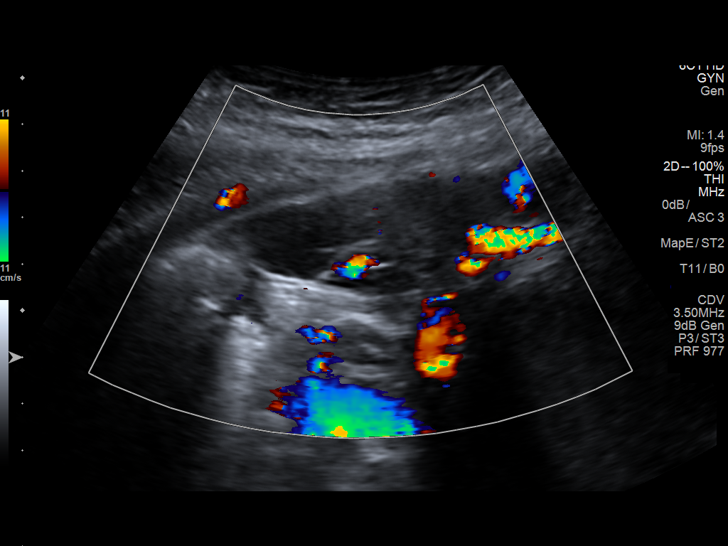
[im 24/32]
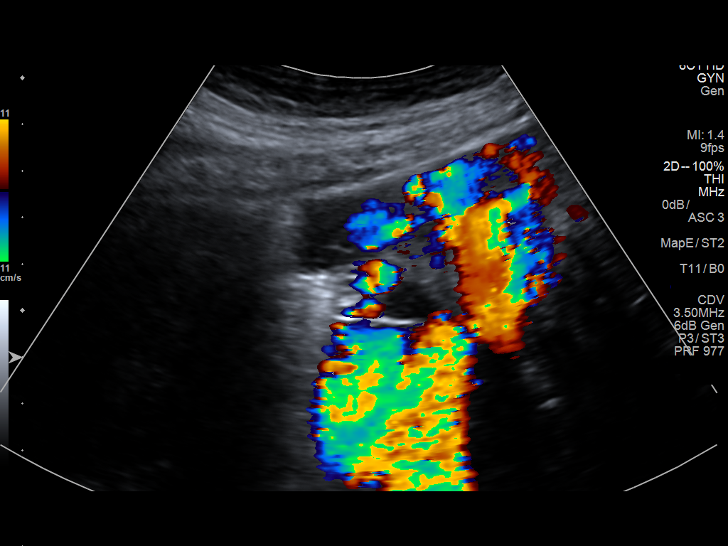
[im 26/32]
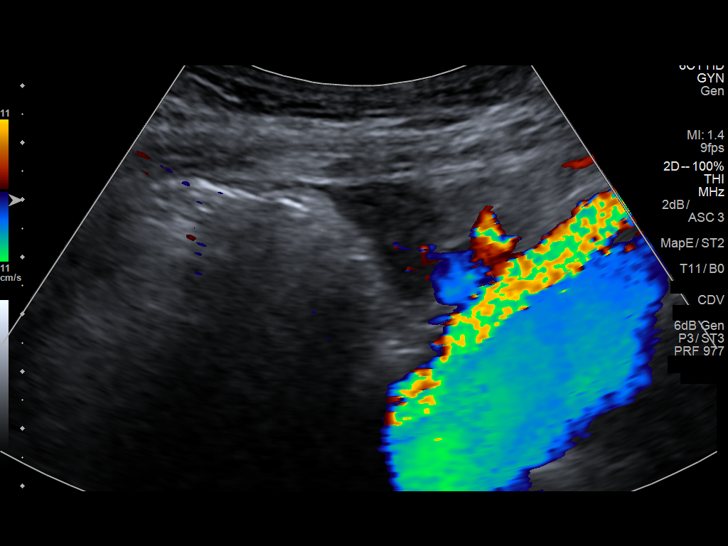
[im 29/32]
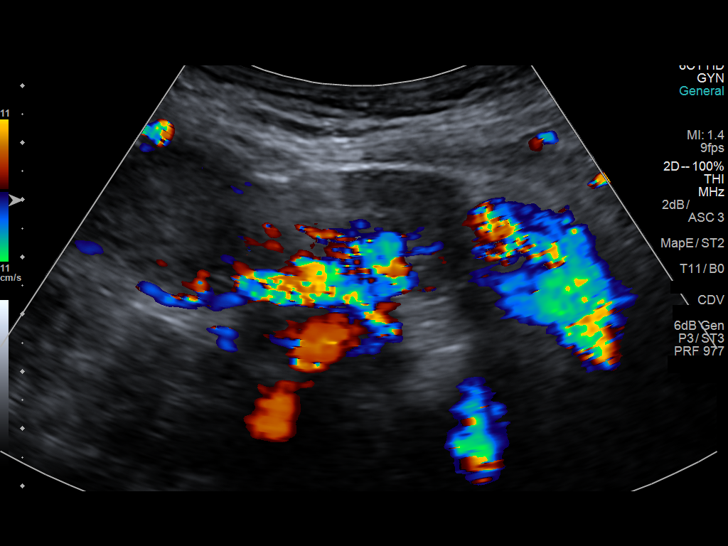
[im 32/32]
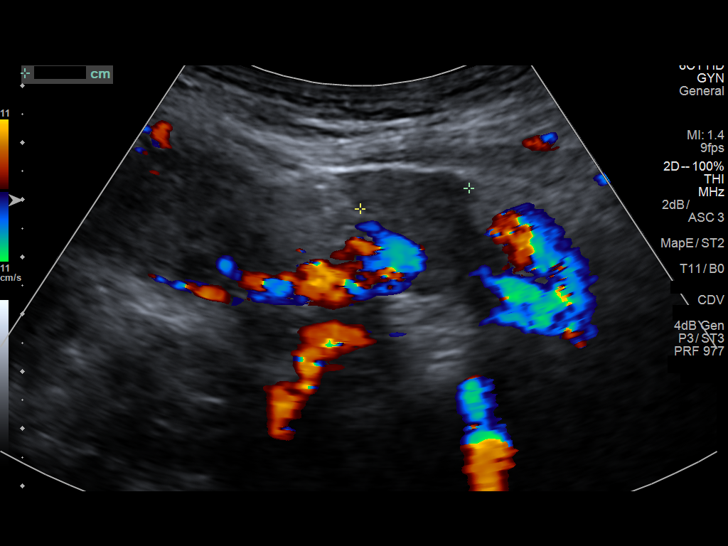

[13 of 25 positions shown; findings below may reference images not displayed]

FINDINGS: Uterus

Measurements: 20.4 x 11.0 x 11.0. Multiple uterine leiomyomata,
largest measuring 7.1 x 6.8 x 7.1 cm at fundus and 6.1 x 5.8 x
cm at the anterior uterus. On a prior CT, additional large leiomyoma
is seen arising from the lateral RIGHT frontal region and extending
into the RIGHT mid abdomen, [DATE] x 8.0 x 10.6 cm, not discretely
measured on this exam.

Endometrium

Thickness: Unable to accurately visualize due to superior spur
distortion by numerous uterine leiomyomata..

Right ovary

Measurements: 3.1 x 2.0 x 4.9 cm.. No definite ovarian mass.

Left ovary

Measurements: 3.2 x 1.9 x 1.9 cm. Normal morphology without mass

Other findings: Free fluid adjacent to RIGHT ovary versus poorly
defined cystic structure, 4 cm maximum dimension.
IMPRESSION: Markedly enlarged uterus containing multiple uterine leiomyomata.

Small amount of free fluid in RIGHT pelvis adjacent to RIGHT ovary
versus a poorly defined paraovarian cystic structure 4 cm diameter ;
this appears slightly larger than the 2.3 cm diameter area of fluid
attenuation adjacent to RIGHT ovary on the previous CT exam.

This potential cystic collection is poorly characterized by both CT
and US ; recommend followup MR pelvis assessment to determine
whether this represents loculated free fluid or a complex cystic
paraovarian mass.

## 2020-10-27 DIAGNOSIS — Z760 Encounter for issue of repeat prescription: Secondary | ICD-10-CM | POA: Diagnosis not present

## 2020-10-27 DIAGNOSIS — E109 Type 1 diabetes mellitus without complications: Secondary | ICD-10-CM | POA: Diagnosis not present

## 2020-11-10 DIAGNOSIS — E1069 Type 1 diabetes mellitus with other specified complication: Secondary | ICD-10-CM | POA: Diagnosis not present

## 2020-11-10 DIAGNOSIS — F329 Major depressive disorder, single episode, unspecified: Secondary | ICD-10-CM | POA: Diagnosis not present

## 2020-11-10 DIAGNOSIS — E890 Postprocedural hypothyroidism: Secondary | ICD-10-CM | POA: Diagnosis not present

## 2020-11-11 DIAGNOSIS — E1069 Type 1 diabetes mellitus with other specified complication: Secondary | ICD-10-CM | POA: Diagnosis not present

## 2020-11-11 DIAGNOSIS — E890 Postprocedural hypothyroidism: Secondary | ICD-10-CM | POA: Diagnosis not present

## 2020-11-25 DIAGNOSIS — Z113 Encounter for screening for infections with a predominantly sexual mode of transmission: Secondary | ICD-10-CM | POA: Diagnosis not present

## 2020-11-25 DIAGNOSIS — D849 Immunodeficiency, unspecified: Secondary | ICD-10-CM | POA: Diagnosis not present

## 2020-11-25 DIAGNOSIS — Z0000 Encounter for general adult medical examination without abnormal findings: Secondary | ICD-10-CM | POA: Diagnosis not present

## 2020-11-25 DIAGNOSIS — E890 Postprocedural hypothyroidism: Secondary | ICD-10-CM | POA: Diagnosis not present

## 2020-11-25 DIAGNOSIS — E1069 Type 1 diabetes mellitus with other specified complication: Secondary | ICD-10-CM | POA: Diagnosis not present

## 2020-11-25 DIAGNOSIS — Z124 Encounter for screening for malignant neoplasm of cervix: Secondary | ICD-10-CM | POA: Diagnosis not present

## 2020-11-26 ENCOUNTER — Other Ambulatory Visit: Payer: Self-pay

## 2020-11-26 ENCOUNTER — Ambulatory Visit (HOSPITAL_COMMUNITY): Payer: Medicaid Other | Admitting: Licensed Clinical Social Worker

## 2020-11-26 ENCOUNTER — Telehealth (HOSPITAL_COMMUNITY): Payer: Self-pay | Admitting: Licensed Clinical Social Worker

## 2020-11-26 NOTE — Telephone Encounter (Signed)
LCSW and pt sign on for video session. Pt starts yelling at young child who starts screaming. Pt mutes her mic for a moment. When she signs back on child is continuing to cry loudly and she is holding child. Hearing is difficult.Circumstances not appropriate to be able to conduct a CCA. Advised pt she will be rescheduled and asked her to do her best to get help with child care for one hour when rescheduled. Pt reports she is going to DSS to try to get help with child care today but is uncertain if she can get child care. Again asked her to do her best as content of CCA not suitable for child to listen to.
